# Patient Record
Sex: Female | Born: 1935 | Race: White | Hispanic: No | Smoking: Former smoker
Health system: Southern US, Community
[De-identification: ages and names within clinical notes are randomized; demographics above are authoritative.]

## PROBLEM LIST (undated history)

## (undated) DIAGNOSIS — I340 Nonrheumatic mitral (valve) insufficiency: Secondary | ICD-10-CM

## (undated) DIAGNOSIS — I5022 Chronic systolic (congestive) heart failure: Secondary | ICD-10-CM

## (undated) DIAGNOSIS — I10 Essential (primary) hypertension: Secondary | ICD-10-CM

## (undated) DIAGNOSIS — N189 Chronic kidney disease, unspecified: Secondary | ICD-10-CM

## (undated) DIAGNOSIS — J45909 Unspecified asthma, uncomplicated: Secondary | ICD-10-CM

## (undated) DIAGNOSIS — I4891 Unspecified atrial fibrillation: Secondary | ICD-10-CM

## (undated) DIAGNOSIS — I429 Cardiomyopathy, unspecified: Secondary | ICD-10-CM

## (undated) DIAGNOSIS — I351 Nonrheumatic aortic (valve) insufficiency: Secondary | ICD-10-CM

## (undated) DIAGNOSIS — M109 Gout, unspecified: Secondary | ICD-10-CM

## (undated) DIAGNOSIS — I071 Rheumatic tricuspid insufficiency: Secondary | ICD-10-CM

## (undated) DIAGNOSIS — J449 Chronic obstructive pulmonary disease, unspecified: Secondary | ICD-10-CM

## (undated) HISTORY — PX: ABDOMINAL HYSTERECTOMY: SHX81

## (undated) HISTORY — PX: BLADDER SURGERY: SHX569

---

## 2004-08-02 ENCOUNTER — Ambulatory Visit: Payer: Self-pay | Admitting: Internal Medicine

## 2005-11-02 ENCOUNTER — Ambulatory Visit: Payer: Self-pay | Admitting: Internal Medicine

## 2006-11-06 ENCOUNTER — Ambulatory Visit: Payer: Self-pay | Admitting: Internal Medicine

## 2007-07-02 ENCOUNTER — Ambulatory Visit: Payer: Self-pay | Admitting: Family

## 2007-11-18 ENCOUNTER — Ambulatory Visit: Payer: Self-pay | Admitting: Family

## 2007-12-11 ENCOUNTER — Ambulatory Visit: Payer: Self-pay | Admitting: Internal Medicine

## 2008-01-20 ENCOUNTER — Ambulatory Visit: Payer: Self-pay | Admitting: Internal Medicine

## 2008-04-29 ENCOUNTER — Ambulatory Visit: Payer: Self-pay | Admitting: Family

## 2008-11-11 ENCOUNTER — Ambulatory Visit: Payer: Self-pay | Admitting: Family

## 2009-01-20 ENCOUNTER — Ambulatory Visit: Payer: Self-pay | Admitting: Internal Medicine

## 2009-02-10 ENCOUNTER — Ambulatory Visit: Payer: Self-pay | Admitting: Family

## 2009-07-21 ENCOUNTER — Ambulatory Visit: Payer: Self-pay | Admitting: Family

## 2009-10-15 ENCOUNTER — Ambulatory Visit: Payer: Self-pay | Admitting: Family

## 2010-02-03 ENCOUNTER — Ambulatory Visit: Payer: Self-pay | Admitting: Internal Medicine

## 2010-02-03 ENCOUNTER — Ambulatory Visit: Payer: Self-pay | Admitting: Family

## 2010-05-06 ENCOUNTER — Ambulatory Visit: Payer: Self-pay | Admitting: Family

## 2010-08-17 ENCOUNTER — Ambulatory Visit: Payer: Self-pay | Admitting: Family

## 2011-04-20 ENCOUNTER — Ambulatory Visit: Payer: Self-pay | Admitting: Family Medicine

## 2011-11-15 ENCOUNTER — Ambulatory Visit: Payer: Self-pay | Admitting: Family

## 2013-10-10 ENCOUNTER — Inpatient Hospital Stay: Payer: Self-pay | Admitting: Family Medicine

## 2013-10-10 LAB — COMPREHENSIVE METABOLIC PANEL
ALK PHOS: 89 U/L
Albumin: 3.9 g/dL (ref 3.4–5.0)
Anion Gap: 4 — ABNORMAL LOW (ref 7–16)
BUN: 48 mg/dL — ABNORMAL HIGH (ref 7–18)
Bilirubin,Total: 0.6 mg/dL (ref 0.2–1.0)
CALCIUM: 9.9 mg/dL (ref 8.5–10.1)
CHLORIDE: 101 mmol/L (ref 98–107)
CREATININE: 1.63 mg/dL — AB (ref 0.60–1.30)
Co2: 28 mmol/L (ref 21–32)
EGFR (African American): 33 — ABNORMAL LOW
EGFR (Non-African Amer.): 28 — ABNORMAL LOW
GLUCOSE: 145 mg/dL — AB (ref 65–99)
Osmolality: 282 (ref 275–301)
Potassium: 4.9 mmol/L (ref 3.5–5.1)
SGOT(AST): 35 U/L (ref 15–37)
SGPT (ALT): 27 U/L (ref 12–78)
SODIUM: 133 mmol/L — AB (ref 136–145)
Total Protein: 7.3 g/dL (ref 6.4–8.2)

## 2013-10-10 LAB — URINALYSIS, COMPLETE
Bilirubin,UR: NEGATIVE
Blood: NEGATIVE
GLUCOSE, UR: NEGATIVE mg/dL (ref 0–75)
Ketone: NEGATIVE
Nitrite: POSITIVE
Ph: 6 (ref 4.5–8.0)
Protein: NEGATIVE
SPECIFIC GRAVITY: 1.008 (ref 1.003–1.030)
WBC UR: 59 /HPF (ref 0–5)

## 2013-10-10 LAB — CBC
HCT: 34.4 % — ABNORMAL LOW (ref 35.0–47.0)
HGB: 11.7 g/dL — ABNORMAL LOW (ref 12.0–16.0)
MCH: 33.9 pg (ref 26.0–34.0)
MCHC: 33.9 g/dL (ref 32.0–36.0)
MCV: 100 fL (ref 80–100)
Platelet: 157 10*3/uL (ref 150–440)
RBC: 3.44 10*6/uL — AB (ref 3.80–5.20)
RDW: 13.8 % (ref 11.5–14.5)
WBC: 19.9 10*3/uL — ABNORMAL HIGH (ref 3.6–11.0)

## 2013-10-10 LAB — CK TOTAL AND CKMB (NOT AT ARMC)
CK, Total: 103 U/L
CK-MB: 2.4 ng/mL (ref 0.5–3.6)

## 2013-10-10 LAB — PROTIME-INR
INR: 2.8
Prothrombin Time: 28.6 secs — ABNORMAL HIGH (ref 11.5–14.7)

## 2013-10-10 LAB — TROPONIN I: Troponin-I: <0.02 ng/mL

## 2013-10-12 LAB — CBC WITH DIFFERENTIAL/PLATELET
BASOS ABS: 0.1 10*3/uL (ref 0.0–0.1)
Basophil %: 0.6 %
EOS PCT: 3.2 %
Eosinophil #: 0.3 10*3/uL (ref 0.0–0.7)
HCT: 20.9 % — ABNORMAL LOW (ref 35.0–47.0)
HGB: 7.2 g/dL — ABNORMAL LOW (ref 12.0–16.0)
LYMPHS ABS: 0.9 10*3/uL — AB (ref 1.0–3.6)
LYMPHS PCT: 10.2 %
MCH: 34.4 pg — AB (ref 26.0–34.0)
MCHC: 34.4 g/dL (ref 32.0–36.0)
MCV: 100 fL (ref 80–100)
MONO ABS: 0.9 x10 3/mm (ref 0.2–0.9)
Monocyte %: 9.9 %
Neutrophil #: 7 10*3/uL — ABNORMAL HIGH (ref 1.4–6.5)
Neutrophil %: 76.1 %
Platelet: 97 10*3/uL — ABNORMAL LOW (ref 150–440)
RBC: 2.09 10*6/uL — AB (ref 3.80–5.20)
RDW: 13.5 % (ref 11.5–14.5)
WBC: 9.2 10*3/uL (ref 3.6–11.0)

## 2013-10-12 LAB — BASIC METABOLIC PANEL
Anion Gap: 4 — ABNORMAL LOW (ref 7–16)
BUN: 30 mg/dL — AB (ref 7–18)
CREATININE: 1.16 mg/dL (ref 0.60–1.30)
Calcium, Total: 8.1 mg/dL — ABNORMAL LOW (ref 8.5–10.1)
Chloride: 107 mmol/L (ref 98–107)
Co2: 25 mmol/L (ref 21–32)
EGFR (African American): 49 — ABNORMAL LOW
GFR CALC NON AF AMER: 43 — AB
GLUCOSE: 107 mg/dL — AB (ref 65–99)
OSMOLALITY: 279 (ref 275–301)
POTASSIUM: 4.4 mmol/L (ref 3.5–5.1)
Sodium: 136 mmol/L (ref 136–145)

## 2013-10-12 LAB — PROTIME-INR
INR: 4.5
PROTHROMBIN TIME: 41.4 s — AB (ref 11.5–14.7)

## 2013-10-13 LAB — BASIC METABOLIC PANEL
ANION GAP: 3 — AB (ref 7–16)
Anion Gap: 5 — ABNORMAL LOW (ref 7–16)
BUN: 21 mg/dL — ABNORMAL HIGH (ref 7–18)
BUN: 21 mg/dL — ABNORMAL HIGH (ref 7–18)
CO2: 26 mmol/L (ref 21–32)
Calcium, Total: 8 mg/dL — ABNORMAL LOW (ref 8.5–10.1)
Calcium, Total: 8.3 mg/dL — ABNORMAL LOW (ref 8.5–10.1)
Chloride: 110 mmol/L — ABNORMAL HIGH (ref 98–107)
Chloride: 106 mmol/L (ref 98–107)
Co2: 26 mmol/L (ref 21–32)
Creatinine: 0.92 mg/dL (ref 0.60–1.30)
Creatinine: 1.09 mg/dL (ref 0.60–1.30)
EGFR (African American): 53 — ABNORMAL LOW
EGFR (Non-African Amer.): 56 — ABNORMAL LOW
GFR CALC NON AF AMER: 46 — AB
Glucose: 90 mg/dL (ref 65–99)
Glucose: 118 mg/dL — ABNORMAL HIGH (ref 65–99)
Osmolality: 280 (ref 275–301)
Osmolality: 278 (ref 275–301)
POTASSIUM: 4.4 mmol/L (ref 3.5–5.1)
Potassium: 4.7 mmol/L (ref 3.5–5.1)
Sodium: 139 mmol/L (ref 136–145)
Sodium: 137 mmol/L (ref 136–145)

## 2013-10-13 LAB — CBC WITH DIFFERENTIAL/PLATELET
Basophil #: 0.1 10*3/uL (ref 0.0–0.1)
Basophil %: 0.9 %
EOS ABS: 0.3 10*3/uL (ref 0.0–0.7)
Eosinophil %: 4.1 %
HCT: 19.1 % — AB (ref 35.0–47.0)
HGB: 6.5 g/dL — AB (ref 12.0–16.0)
LYMPHS ABS: 0.8 10*3/uL — AB (ref 1.0–3.6)
LYMPHS PCT: 10.2 %
MCH: 34.1 pg — ABNORMAL HIGH (ref 26.0–34.0)
MCHC: 34.1 g/dL (ref 32.0–36.0)
MCV: 100 fL (ref 80–100)
MONOS PCT: 9.5 %
Monocyte #: 0.8 x10 3/mm (ref 0.2–0.9)
NEUTROS ABS: 6 10*3/uL (ref 1.4–6.5)
NEUTROS PCT: 75.3 %
Platelet: 101 10*3/uL — ABNORMAL LOW (ref 150–440)
RBC: 1.91 10*6/uL — AB (ref 3.80–5.20)
RDW: 13.6 % (ref 11.5–14.5)
WBC: 7.9 10*3/uL (ref 3.6–11.0)

## 2013-10-13 LAB — PROTIME-INR
INR: 4.8
Prothrombin Time: 43.6 secs — ABNORMAL HIGH (ref 11.5–14.7)

## 2013-10-14 ENCOUNTER — Encounter: Payer: Self-pay | Admitting: Internal Medicine

## 2013-10-14 LAB — BASIC METABOLIC PANEL
Anion Gap: 3 — ABNORMAL LOW (ref 7–16)
BUN: 21 mg/dL — ABNORMAL HIGH (ref 7–18)
CHLORIDE: 108 mmol/L — AB (ref 98–107)
CO2: 28 mmol/L (ref 21–32)
Calcium, Total: 8.4 mg/dL — ABNORMAL LOW (ref 8.5–10.1)
Creatinine: 1.05 mg/dL (ref 0.60–1.30)
EGFR (African American): 56 — ABNORMAL LOW
EGFR (Non-African Amer.): 48 — ABNORMAL LOW
GLUCOSE: 81 mg/dL (ref 65–99)
Osmolality: 280 (ref 275–301)
POTASSIUM: 4.8 mmol/L (ref 3.5–5.1)
Sodium: 139 mmol/L (ref 136–145)

## 2013-10-14 LAB — CBC WITH DIFFERENTIAL/PLATELET
Basophil #: 0.1 10*3/uL (ref 0.0–0.1)
Basophil %: 0.7 %
Eosinophil #: 0.5 10*3/uL (ref 0.0–0.7)
Eosinophil %: 4.8 %
HCT: 27 % — ABNORMAL LOW (ref 35.0–47.0)
HGB: 9.6 g/dL — ABNORMAL LOW (ref 12.0–16.0)
Lymphocyte #: 0.8 10*3/uL — ABNORMAL LOW (ref 1.0–3.6)
Lymphocyte %: 9 %
MCH: 34.2 pg — AB (ref 26.0–34.0)
MCHC: 35.7 g/dL (ref 32.0–36.0)
MCV: 96 fL (ref 80–100)
Monocyte #: 1 x10 3/mm — ABNORMAL HIGH (ref 0.2–0.9)
Monocyte %: 10.3 %
NEUTROS ABS: 7 10*3/uL — AB (ref 1.4–6.5)
Neutrophil %: 75.2 %
Platelet: 119 10*3/uL — ABNORMAL LOW (ref 150–440)
RBC: 2.82 10*6/uL — ABNORMAL LOW (ref 3.80–5.20)
RDW: 15.7 % — AB (ref 11.5–14.5)
WBC: 9.3 10*3/uL (ref 3.6–11.0)

## 2013-10-14 LAB — URINE CULTURE

## 2013-10-15 LAB — CBC WITH DIFFERENTIAL/PLATELET
Bands: 3 %
Basophil: 1 %
Eosinophil: 8 %
HCT: 27.5 % — ABNORMAL LOW (ref 35.0–47.0)
HGB: 9.6 g/dL — ABNORMAL LOW (ref 12.0–16.0)
LYMPHS PCT: 10 %
MCH: 34 pg (ref 26.0–34.0)
MCHC: 34.9 g/dL (ref 32.0–36.0)
MCV: 97 fL (ref 80–100)
METAMYELOCYTE: 1 %
Monocytes: 12 %
Platelet: 136 10*3/uL — ABNORMAL LOW (ref 150–440)
RBC: 2.82 10*6/uL — AB (ref 3.80–5.20)
RDW: 15 % — ABNORMAL HIGH (ref 11.5–14.5)
SEGMENTED NEUTROPHILS: 65 %
WBC: 8.6 10*3/uL (ref 3.6–11.0)

## 2013-10-15 LAB — PROTIME-INR
INR: 2
PROTHROMBIN TIME: 22.2 s — AB (ref 11.5–14.7)

## 2013-10-15 LAB — CULTURE, BLOOD (SINGLE)

## 2013-10-21 LAB — PROTIME-INR
INR: 1.1
Prothrombin Time: 14.2 secs (ref 11.5–14.7)

## 2013-10-22 ENCOUNTER — Encounter: Payer: Self-pay | Admitting: Internal Medicine

## 2014-03-20 ENCOUNTER — Ambulatory Visit: Payer: Self-pay | Admitting: Family Medicine

## 2014-11-14 NOTE — Consult Note (Signed)
PATIENT NAME:  Alicia Trujillo, Alicia Trujillo MR#:  [id number] DATE OF BIRTH:  [birth year]  DATE OF CONSULTATION:  10/13/2013  CONSULTING PHYSICIAN:  Leitha SchullerMichael J. Lehua Flores, MD  REASON FOR CONSULTATION: Right-sided pelvic fracture.   HISTORY OF PRESENT ILLNESS: The patient is an 81 year old who suffered a fall. She had pain in the right side of her hip. X-ray revealed superior and inferior rami fractures. She reports pain, and she describes it more as an ache.   PHYSICAL EXAMINATION: She has some pain with logrolling of the leg. She has point tenderness over the anterior pubis superiorly. X-rays show minimally displaced fractures. No evidence of more extensive injury.   CLINICAL IMPRESSION: Right-sided pubic rami fractures.   RECOMMENDATION: Weight-bearing as tolerated with physical therapy. Discussed with her and her son that this usually takes about a month to heal adequately where it is no longer inhibiting ability to ambulate. Encouraged her to ambulate as tolerated.   ____________________________ Leitha SchullerMichael J. Nikyah Lackman, MD mjm:sg D: 10/14/2013 06:56:51 ET T: 10/14/2013 07:06:01 ET JOB#: [id number]  cc: Leitha SchullerMichael J. Maripat Borba, MD, <Dictator> Leitha SchullerMICHAEL J Mycala Warshawsky MD ELECTRONICALLY SIGNED 10/14/2013 9:42

## 2014-11-14 NOTE — Discharge Summary (Signed)
PATIENT NAME:  Alicia KannerBRUNER, [name] MR#:  [id number] DATE OF BIRTH:  [birth year]  DATE OF ADMISSION:  10/10/2013 DATE OF DISCHARGE:  10/15/2013   DISCHARGE DIAGNOSES:  1. Acute pelvic fracture.  2. Pyuria, negative urine culture.  3. Anemia.  4. Presyncopal episode.  5. Atrial fibrillation, Coumadin is being held.  6. Asthma and chronic obstructive pulmonary disease   DISCHARGE MEDICATIONS:  1. Multivitamin 1 tablet p.o. daily.  2. Calcium carbonate 600 mg p.o. daily 3. Spironolactone 25 mg p.o. b.i.d.  4. Singulair 10 mg p.o. daily.  5. Toprol 25 mg p.o. b.i.d.  6. Combivent as directed.  7. Altace 2.5 mg p.o. daily.  8. Fexofenadine 180 mg p.o. daily.  9. Furosemide 40 mg p.o. b.i.d.  10. Allopurinol 100 mg p.o. b.i.d.  11. Tramadol 50 mg q.6 hours as needed for pain.   MEDICATIONS TO BE HELD: Coumadin.   CONSULTATIONS: Orthopedics.   PROCEDURES: The patient did receive 2 units of packed red blood cells.   PERTINENT LABORATORY DATA ON DATE OF DISCHARGE: Hemoglobin was 9.6, INR of 2. Urine culture showed no significant growth of bacteria.   BRIEF HOSPITAL COURSE:  1. Acute pelvic fracture status post presyncopal episode. The patient reports having a fall causing a pelvic fracture. She was evaluated by orthopedics, who did not think that she needed any surgical intervention. Will do pain control and PT and ambulate as tolerated.  2. Acute anemia, unsure etiology, but likely due to recent pelvic fracture. Hemoglobin dropped as low as 6.5. She was transfused 2 units and responded with a hemoglobin of 9.6, which has remained stable in 48 hours. She has no acute bleeding from any other site.  3. Pyuria. The patient initially came in with a urinalysis positive for white blood cells. She was essentially asymptomatic. Urine culture showed no growth. She did get 3 days of ceftriaxone. Will discontinue any further oral antibiotics at this time, especially with her Coumadin dose being  supratherapeutic upon admission.  4. Atrial fibrillation. The patient is rate controlled and asymptomatic. She did have a supratherapeutic INR initially, INR of 4.8. Coumadin was held. Because she has had recent antibiotics, I want to continue to hold the Coumadin at this time, especially with her acute anemia. May consider restarting the Coumadin as an outpatient once her hemoglobin stabilizes and she follows up in the clinic. I would recommend an INR checked in the next 3 to 4 days and will document that in our notes.  5. Other chronic issues are stable. Continue home regimen.   DISPOSITION: She will need further rehab through physical therapy. She is going to a skilled nursing facility. Follow up with Dr. Burnadette PopLinthavong after discharge from the rehab. Again, she needs a Coumadin check in 3 or 4 days.   ____________________________ Marisue IvanKanhka Rei Contee, MD kl:lb D: 10/15/2013 10:31:41 ET T: 10/15/2013 11:35:00 ET JOB#: [id number]  cc: Marisue IvanKanhka Marv Alfrey, MD, <Dictator> Marisue IvanKANHKA Kenyatte Gruber MD ELECTRONICALLY SIGNED 10/27/2013 10:35

## 2014-11-14 NOTE — Consult Note (Signed)
Brief Consult Note: Diagnosis: right side pelvic fracture, stable patter.   Patient was seen by consultant.   Recommend further assessment or treatment.   Orders entered.   Comments: PT ordered, WBAT.  Electronic Signatures: Leitha SchullerMenz, Suzann Lazaro J (MD)  (Signed 23-Mar-15 17:01)  Authored: Brief Consult Note   Last Updated: 23-Mar-15 17:01 by Leitha SchullerMenz, Emil Klassen J (MD)

## 2014-11-14 NOTE — H&P (Signed)
PATIENT NAME:  Alicia Trujillo, [name] MR#:  [id number] DATE OF BIRTH:  03/28/1925  DATE OF ADMISSION:  10/10/2013   [facility]  REFERRING PHYSICIAN: Dr. Carollee Trujillo.   CHIEF COMPLAINT: Fall today.   HISTORY OF PRESENT ILLNESS:  An 81 year old Caucasian female with a history of CHF, asthma, who was sent to ED due to fall today. The patient tripped and fell at  home today. The patient when to PCP's office where the patient was almost faint, so the patient was sent to the ED for further evaluation. In ED, the patient was noted to have a UTI. Blood pressure was low at 87 systolic. The patient was treated with normal saline bolus. In addition, the patient has leukocytosis, was treated with antibiotics. The patient is hard of hearing, very difficult to get a detailed history. History is from Dr. Carollee Trujillo and the patient's son.   PAST MEDICAL HISTORY: CHF, asthma.   PAST SURGICAL HISTORY: Hysterectomy.   SOCIAL HISTORY: No smoking or drinking or illicit drugs.   FAMILY HISTORY: Heart and lung disease in her family.    ALLERGIES: SULFA AND LATEX.   HOME MEDICATIONS:  1.  Coumadin 3 mg p.o. once a day on Saturday, Sunday, and 2.5 mg from Monday through Friday.  2.  Spironolactone 25 mg 1 tablet b.i.d.  3.  Singulair 10 mg p.o. daily.  4.  Multivitamin 1 tablet p.o. daily.  5.  Lopressor 25 mg p.o. b.i.d. 6.  Lasix 40 mg p.o. p.o. b.i.d.  7.  Fexofenadine tablet 180 mg p.o. once a day.  8.  Combivent aerosol 2 puffs 4 to 6 times a day p.r.n.  9.  Calcium carbonate tablets 600 mg p.o. daily.  10.  Altace capsule 2.5 mg p.o. daily.  11. Allopurinol 100 mg p.o. b.i.d.   REVIEW OF SYSTEMS:  The patient is hard of hearing. It is very difficulty to get a review of systems, but the patient denies any symptoms.     PHYSICAL EXAMINATION: VITAL SIGNS: Temperature 98.4. Blood pressure was 87/52, and now increased to over 105/51; pulse 94, O2 saturation 99% on room air.  Respirations 24.  GENERAL: The patient is alert, awake, oriented, in no acute distress.  HEENT: Pupils round, equal and reactive to light and accommodation. Moist oral mucosa. Clear oropharynx.  NECK: Supple. No JVD or carotid bruit and no lymphadenopathy. CARDIOVASCULAR: S1 and S2, regular rate and rhythm. No murmurs or gallops.   PULMONARY: Bilateral air entry. No wheezing. No rales. No use of accessory muscle to breathe.  ABDOMEN: Soft. No distention. No tenderness. No organomegaly. Bowel sounds present.  EXTREMITIES: No edema, clubbing or cyanosis. No calf tenderness. Bilateral pedal pulses present.  SKIN: No rash or jaundice.  NEUROLOGIC: The patient is alert, awake, oriented but was hard of hearing. Follows commands. No focal deficit, but the patient is unable to move bilateral lower extremities due to pain.   LABORATORY DATA: Urinalysis shows WBC 59, nitrite positive. CK 103, CK-MB 2.4. Glucose 145, BUN 48, creatinine 1.63, sodium 133, potassium 4.9, chloride 101, bicarb 28. Liver function test is normal. WBC 19.9, hemoglobin 11.7, platelets 157. INR 2.8. Femoral x-ray showed superior pubic ramus fracture.  Pelvis x-ray showed right superior pubic ramus  fracture. IMPRESSIONS: 1.  Sepsis.  2.  Urinary tract infection.  3.  Hypotension.  4.  Acute renal failure.  5.  Hyponatremia.  6.  Pelvic fracture.  7.  History of congestive heart failure.  8.  Asthma.   PLAN OF TREATMENT: 1.  The patient will be admitted to medical floor. We will give her Rocephin and follow up her urine culture, CBC and blood culture.  2.  We will hold the patient's spironolactone, Lasix, Altace, allopurinol and start IV normal saline. Follow up her BMP.  3.  For history of asthma, continue Combivent nebulizer p.r.n.  4.  The patient's son said the patient is on Coumadin due to CHF. We will continue Coumadin. Follow up INR.  5.  I discussed the patient's condition and plan of treatment with the patient and the  patient's son.  The patient wants FULL CODE.   TIME SPENT: About 58 minutes.    ____________________________ Shaune Pollack, MD qc:dmm D: 10/10/2013 21:21:25 ET T: 10/10/2013 21:44:33 ET JOB#: [id number]  cc: Shaune Pollack, MD, <Dictator> Shaune Pollack MD ELECTRONICALLY SIGNED 10/12/2013 15:59

## 2015-01-03 ENCOUNTER — Encounter: Payer: Self-pay | Admitting: *Deleted

## 2015-01-03 ENCOUNTER — Emergency Department
Admission: EM | Admit: 2015-01-03 | Discharge: 2015-01-03 | Disposition: A | Discharge status: Home / Self Care | Payer: Medicare Other | Attending: Emergency Medicine | Admitting: Emergency Medicine

## 2015-01-03 DIAGNOSIS — Z79899 Other long term (current) drug therapy: Secondary | ICD-10-CM | POA: Insufficient documentation

## 2015-01-03 DIAGNOSIS — I129 Hypertensive chronic kidney disease with stage 1 through stage 4 chronic kidney disease, or unspecified chronic kidney disease: Secondary | ICD-10-CM | POA: Diagnosis not present

## 2015-01-03 DIAGNOSIS — N189 Chronic kidney disease, unspecified: Secondary | ICD-10-CM | POA: Insufficient documentation

## 2015-01-03 DIAGNOSIS — Y9289 Other specified places as the place of occurrence of the external cause: Secondary | ICD-10-CM | POA: Insufficient documentation

## 2015-01-03 DIAGNOSIS — D684 Acquired coagulation factor deficiency: Secondary | ICD-10-CM | POA: Diagnosis not present

## 2015-01-03 DIAGNOSIS — Y9389 Activity, other specified: Secondary | ICD-10-CM | POA: Diagnosis not present

## 2015-01-03 DIAGNOSIS — S81812A Laceration without foreign body, left lower leg, initial encounter: Secondary | ICD-10-CM | POA: Insufficient documentation

## 2015-01-03 DIAGNOSIS — Z87891 Personal history of nicotine dependence: Secondary | ICD-10-CM | POA: Insufficient documentation

## 2015-01-03 DIAGNOSIS — W228XXA Striking against or struck by other objects, initial encounter: Secondary | ICD-10-CM | POA: Insufficient documentation

## 2015-01-03 DIAGNOSIS — Z91040 Latex allergy status: Secondary | ICD-10-CM | POA: Diagnosis not present

## 2015-01-03 DIAGNOSIS — Z7901 Long term (current) use of anticoagulants: Secondary | ICD-10-CM | POA: Diagnosis not present

## 2015-01-03 DIAGNOSIS — S80822A Blister (nonthermal), left lower leg, initial encounter: Secondary | ICD-10-CM | POA: Insufficient documentation

## 2015-01-03 DIAGNOSIS — Y998 Other external cause status: Secondary | ICD-10-CM | POA: Diagnosis not present

## 2015-01-03 DIAGNOSIS — T148XXA Other injury of unspecified body region, initial encounter: Secondary | ICD-10-CM

## 2015-01-03 DIAGNOSIS — IMO0002 Reserved for concepts with insufficient information to code with codable children: Secondary | ICD-10-CM

## 2015-01-03 HISTORY — DX: Chronic kidney disease, unspecified: N18.9

## 2015-01-03 HISTORY — DX: Unspecified atrial fibrillation: I48.91

## 2015-01-03 HISTORY — DX: Essential (primary) hypertension: I10

## 2015-01-03 HISTORY — DX: Gout, unspecified: M10.9

## 2015-01-03 HISTORY — DX: Unspecified asthma, uncomplicated: J45.909

## 2015-01-03 HISTORY — DX: Cardiomyopathy, unspecified: I42.9

## 2015-01-03 HISTORY — DX: Chronic obstructive pulmonary disease, unspecified: J44.9

## 2015-01-03 LAB — CBC WITH DIFFERENTIAL/PLATELET
BASOS ABS: 0 10*3/uL (ref 0–0.1)
Basophils Relative: 0 %
Eosinophils Absolute: 0 10*3/uL (ref 0–0.7)
Eosinophils Relative: 0 %
HEMATOCRIT: 29.1 % — AB (ref 35.0–47.0)
Hemoglobin: 9.5 g/dL — ABNORMAL LOW (ref 12.0–16.0)
Lymphs Abs: 0.6 10*3/uL — ABNORMAL LOW (ref 1.0–3.6)
MCH: 30.8 pg (ref 26.0–34.0)
MCHC: 32.4 g/dL (ref 32.0–36.0)
MCV: 95 fL (ref 80.0–100.0)
Monocytes Absolute: 0.6 10*3/uL (ref 0.2–0.9)
Neutro Abs: 5.8 10*3/uL (ref 1.4–6.5)
Platelets: 65 10*3/uL — ABNORMAL LOW (ref 150–440)
RBC: 3.07 MIL/uL — AB (ref 3.80–5.20)
RDW: 17.5 % — ABNORMAL HIGH (ref 11.5–14.5)
WBC: 7 10*3/uL (ref 3.6–11.0)

## 2015-01-03 LAB — BASIC METABOLIC PANEL
Anion gap: 14 (ref 5–15)
BUN: 117 mg/dL — AB (ref 6–20)
CALCIUM: 9 mg/dL (ref 8.9–10.3)
CHLORIDE: 86 mmol/L — AB (ref 101–111)
CO2: 34 mmol/L — ABNORMAL HIGH (ref 22–32)
Creatinine, Ser: 2.34 mg/dL — ABNORMAL HIGH (ref 0.44–1.00)
GFR calc Af Amer: 20 mL/min — ABNORMAL LOW (ref >60)
GFR calc non Af Amer: 18 mL/min — ABNORMAL LOW (ref >60)
Glucose, Bld: 109 mg/dL — ABNORMAL HIGH (ref 65–99)
POTASSIUM: 2.8 mmol/L — AB (ref 3.5–5.1)
SODIUM: 134 mmol/L — AB (ref 135–145)

## 2015-01-03 LAB — PROTIME-INR
INR: 4.92 — AB
Prothrombin Time: 46.6 seconds — ABNORMAL HIGH (ref 11.4–15.0)

## 2015-01-03 MED ORDER — POTASSIUM CHLORIDE ER 10 MEQ PO TBCR
20.0000 meq | EXTENDED_RELEASE_TABLET | Freq: Two times a day (BID) | ORAL | Status: DC
Start: 2015-01-03 — End: 2015-01-19

## 2015-01-03 MED ORDER — POTASSIUM CHLORIDE CRYS ER 20 MEQ PO TBCR
40.0000 meq | EXTENDED_RELEASE_TABLET | Freq: Once | ORAL | Status: AC
  Administered 2015-01-03: 40 meq via ORAL

## 2015-01-03 MED ORDER — PHYTONADIONE 5 MG PO TABS
ORAL_TABLET | ORAL | Status: AC
  Administered 2015-01-03: 2.5 mg via ORAL
  Filled 2015-01-03: qty 1

## 2015-01-03 MED ORDER — PHYTONADIONE 5 MG PO TABS
2.5000 mg | ORAL_TABLET | Freq: Once | ORAL | Status: AC
  Administered 2015-01-03: 2.5 mg via ORAL

## 2015-01-03 MED ORDER — POTASSIUM CHLORIDE CRYS ER 20 MEQ PO TBCR
EXTENDED_RELEASE_TABLET | ORAL | Status: AC
  Administered 2015-01-03: 20 meq via ORAL
  Filled 2015-01-03: qty 1

## 2015-01-03 MED ORDER — POTASSIUM CHLORIDE CRYS ER 20 MEQ PO TBCR
20.0000 meq | EXTENDED_RELEASE_TABLET | Freq: Once | ORAL | Status: AC
  Administered 2015-01-03: 20 meq via ORAL

## 2015-01-03 MED ORDER — POTASSIUM CHLORIDE CRYS ER 20 MEQ PO TBCR
EXTENDED_RELEASE_TABLET | ORAL | Status: AC
  Administered 2015-01-03: 40 meq via ORAL
  Filled 2015-01-03: qty 2

## 2015-01-03 NOTE — Discharge Instructions (Signed)
Do not give Coumadin tonight or tomorrow night. Speak with Dr. Burnadette Pop about when to restart your Coumadin and with what dose. Follow-up with the doctors at [facility] clinic as planned for ear leg wraps and for ongoing wound care.  If you have bleeding that passes through the dressing or if you have other urgent concerns return to the emergency department immediately.   Anticoagulation, Generic Anticoagulants are medicines used to prevent clots from developing in your veins. These medicine are also known as blood thinners. If blood clots are untreated, they could travel to your lungs. This is called a pulmonary embolus. A blood clot in your lungs can be fatal.  Health care providers often use anticoagulants to prevent clots following surgery. Anticoagulants are also used along with aspirin when the heart is not getting enough blood. Another anticoagulant called warfarin is started 2 to 3 days after a rapid-acting injectable anticoagulant is started. The rapid-acting anticoagulants are usually continued until warfarin has begun to work. Your health care provider will judge this length of time by blood tests known as the prothrombin time (PT) and International Normalization Ratio (INR). This means that your blood is at the necessary and best level to prevent clots. RISKS AND COMPLICATIONS  If you have received recent epidural anesthesia, spinal anesthesia, or a spinal tap while receiving anticoagulants, you are at risk for developing a blood clot in or around the spine. This condition could result in long-term or permanent paralysis.  Because anticoagulants thin your blood, severe bleeding may occur from any tissue or organ. Symptoms of the blood being too thin may include:  Bleeding from the nose or gums that does not stop quickly.  Blood in bowel movements which may appear as bright red, dark, or black tarry stools.  Blood in the urine which may appear as pink, red, or brown urine.  Unusual  bruising or bruising easily.  A cut that does not stop bleeding within 10 minutes.  Vomiting blood or continuous nausea for more than 1 day.  Coughing up blood.  Broken blood vessels in your eye (subconjunctival hemorrhage).  Abdominal or back pain with or without flank bruising.  Sudden, severe headache.  Sudden weakness or numbness of the face, arm, or leg, especially on one side of the body.  Sudden confusion.  Trouble speaking (aphasia) or understanding.  Sudden trouble seeing in one or both eyes.  Sudden trouble walking.  Dizziness.  Loss of balance or coordination.  Vaginal bleeding.  Swelling or pain at an injection site.  Superficial fat tissue death (necrosis) which may cause skin scarring. This is more common in women and may first present as pain in the waist, thighs, or buttocks.  Fever.  Too little anticoagulation continues to allow the risk for blood clots. HOME CARE INSTRUCTIONS   Due to the complications of anticoagulants, it is very important that you take your anticoagulant as directed by your health care provider. Anticoagulants need to be taken exactly as instructed. Be sure you understand all your anticoagulant instructions.  Keep all follow-up appointments with your health care provider as directed. It is very important to keep your appointments. Not keeping appointments could result in a chronic or permanent injury, pain, or disability.  Warfarin. Your health care provider will advise you on the length of treatment (usually 3-6 months, sometimes lifelong).  Take warfarin exactly as directed by your health care provider. It is recommended that you take your warfarin dose at the same time of the day. It is  preferred that you take warfarin in the late afternoon. If you have been told to stop taking warfarin, do not resume taking warfarin until directed to do so by your health care provider. Follow your health care provider's instructions if you  accidentally take an extra dose or miss a dose of warfarin. It is very important to take warfarin as directed since bleeding or blood clots could result in chronic or permanent injury, pain, or disability.  Too much and too little warfarin are both dangerous. Too much warfarin increases the risk of bleeding. Too little warfarin continues to allow the risk for blood clots. While taking warfarin, you will need to have regular blood tests to measure your blood clotting time. These blood tests usually include both the prothrombin time (PT) and International Normalized Ratio (INR) tests. The PT and INR results allow your health care provider to adjust your dose of warfarin. The dose can change for many reasons. It is critically important that you have your PT and INR levels drawn exactly as directed. Your warfarin dose may stay the same or change depending on what the PT and INR results are. Be sure to follow up with your health care provider regarding your PT and INR test results and what your warfarin dosage should be.  Many medicines can interfere with warfarin and affect the PT and INR results. You must tell your health care provider about any and all medicines you take, this includes all vitamins and supplements. Ask your health care provider before taking these. Prescription and over-the-counter medicine consistency is critical to warfarin management. It is important that potential interactions are checked before you start a new medicine. Be especially cautious with aspirin and anti-inflammatory medicines. Ask your health care provider before taking these. Medicines such as antibiotics and acid-reducing medicine can interact with warfarin and can cause an increased warfarin effect. Warfarin can also interfere with the effectiveness of medicines you are taking. Do not take or discontinue any prescribed or over-the-counter medicine except on the advice of your health care provider or pharmacist.  Some vitamins,  supplements, and herbal products interfere with the effectiveness of warfarin. Vitamin E may increase the anticoagulant effects of warfarin. Vitamin K may can cause warfarin to be less effective. Do not take or discontinue any vitamin, supplement, or herbal product except on the advice of your health care provider or pharmacist.  Eat what you normally eat and keep the vitamin K content of your diet consistent. Avoid major changes in your diet, or notify your health care provider before changing your diet. Suddenly getting a lot more vitamin K could cause your blood to clot too quickly. A sudden decrease in vitamin K intake could cause your blood to clot too slowly. These changes in vitamin K intake could lead to dangerous blood clotsor to bleeding. To keep your vitamin K intake consistent, you must be aware of which foods contain moderate or high amounts of vitamin K. Some foods high in vitamin K include spinach, kale, broccoli, cabbage, greens, Brussels sprouts, asparagus, Bok [place]oy, coleslaw, parsley, and green tea. Arrange a visit with a dietitian to answer your questions.  If you have a loss of appetite or get the stomach flu (viral gastroenteritis), talk to your health care provider as soon as possible. A decrease in your normal vitamin K intake can make you more sensitive to your usual dose of warfarin.  Some medical conditions may increase your risk for bleeding while you are taking warfarin. A fever, diarrhea  lasting more than a day, worsening heart failure, or worsening liver function are some medical conditions that could affect warfarin. Contact your health care provider if you have any of these medical conditions.  Alcohol can change the body's ability to handle warfarin. It is best to avoid alcoholic drinks or consume only very small amounts while taking warfarin. Notify your health care provider if you change your alcohol intake. A sudden increase in alcohol use can increase your risk of  bleeding. Chronic alcohol use can cause warfarin to be less effective.  Be careful not to cut yourself when using sharp objects or while shaving.  Inform all your health care providers and your dentist that you take an anticoagulant.  Limit physical activities or sports that could result in a fall or cause injury. Avoid contact sports.  Wear medical alert jewelry or carry a medical alert card. SEEK IMMEDIATE MEDICAL CARE IF:  You cough up blood.  You have dark or black stools or there is bright red blood coming from your rectum.  You vomit blood or have nausea for more than 1 day.  You have blood in the urine or pink colored urine.  You have unusual bruising or have increased bruising.  You have bleeding from the nose or gums that does not stop quickly.  You have a cut that does not stop bleeding within a 2-3 minutes.  You have sudden weakness or numbness of the face, arm, or leg, especially on one side of the body.  You have sudden confusion.  You have trouble speaking (aphasia) or understanding.  You have sudden trouble seeing in one or both eyes.  You have sudden trouble walking.  You have dizziness.  You have a loss of balance or coordination.  You have a sudden, severe headache.  You have a serious fall or head injury, even if you are not bleeding.  You have swelling or pain at an injection site.  You have unexplained tenderness or pain in the abdomen, back, waist, thighs or buttocks.  You have a fever. Any of these symptoms may represent a serious problem that is an emergency. Do not wait to see if the symptoms will go away. Get medical help right away. Call your local emergency services (911 in [place] Do not drive yourself to the hospital. Document Released: 07/10/2005 Document Revised: 07/15/2013 Document Reviewed: 02/12/2008 [facility] Patient Information 2015 [place], [place]. This information is not intended to replace advice given to you by your health care  provider. Make sure you discuss any questions you have with your health care provider.

## 2015-01-03 NOTE — ED Notes (Signed)
Pt arrived due to bleeding from left leg. Pt reports hitting leg on side of bath tub today. Pt reports increased swelling and blistering to left leg with a split in large blister on pts left shin. Pts leg wrapped upon arrival. MD removed wrap. Small amount of bleeding continues at this time. Pt on warfarin.

## 2015-01-03 NOTE — ED Provider Notes (Signed)
[facility] Emergency Department Provider Note  ____________________________________________  Time seen: 48  I have reviewed the triage vital signs and the nursing notes.  Patient is pleasant with apparent mild dementia. History is from her at also from her daughter and later from her son who lives with her.   HISTORY  Chief Complaint Leg Injury  bleeding from left leg    HPI Alicia Trujillo is a 81 y.o. female who is on Coumadin. She was getting out of the shower when she hit her leg on the side of the bathtub. The family reports her legs have been a little dark lately but this injury to an immediate blood blister forming on the left leg and the blister tore open and she began to bleed copiously. The family wrapped the leg and a towel and brought her to the emergency department. Upon placing her in the treatment room, a moderate amount of blood dripped from the towel dressing onto the floor. The patient denies pain. She is alert and communicative.    Past Medical History  Diagnosis Date  . Asthma   . COPD (chronic obstructive pulmonary disease)   . A-fib   . Gout   . Essential hypertension   . Cardiomyopathy   . Chronic kidney disease     Congestive heart failure COPD   There are no active problems to display for this patient.   History reviewed. No pertinent past surgical history.  Current Outpatient Rx  Name  Route  Sig  Dispense  Refill  . albuterol (PROAIR HFA) 108 (90 BASE) MCG/ACT inhaler   Inhalation   Inhale 2 puffs into the lungs every 4 (four) hours as needed.         Marland Kitchen allopurinol (ZYLOPRIM) 100 MG tablet   Oral   Take 100 mg by mouth daily.      0   . calcium-vitamin D (CALCIUM 500/D) 500-200 MG-UNIT per tablet   Oral   Take 1 tablet by mouth daily.         . fexofenadine (ALLEGRA) 180 MG tablet   Oral   Take 180 mg by mouth daily.         . Fluticasone-Salmeterol (ADVAIR DISKUS) 250-50 MCG/DOSE AEPB    Inhalation   Inhale 1 puff into the lungs every 12 (twelve) hours.         . furosemide (LASIX) 40 MG tablet   Oral   Take 40 mg by mouth 2 (two) times daily as needed.         . metolazone (ZAROXOLYN) 2.5 MG tablet   Oral   Take 2.5 mg by mouth daily. Stop if BP drops below 90/60.         Marland Kitchen metoprolol tartrate (LOPRESSOR) 25 MG tablet   Oral   Take 12.5 mg by mouth 2 (two) times daily.         . montelukast (SINGULAIR) 10 MG tablet   Oral   Take 10 mg by mouth daily.         . Multiple Vitamin (MULTI-VITAMINS) TABS   Oral   Take 1 tablet by mouth daily.         Marland Kitchen spironolactone (ALDACTONE) 25 MG tablet   Oral   Take 25 mg by mouth daily.         Marland Kitchen warfarin (COUMADIN) 2 MG tablet   Oral   Take 2 mg by mouth daily.         . potassium chloride (  K-DUR) 10 MEQ tablet   Oral   Take 2 tablets (20 mEq total) by mouth 2 (two) times daily.   6 tablet   0     Allergies Latex; Levofloxacin; and Sulfa antibiotics  History reviewed. No pertinent family history.  Social History History  Substance Use Topics  . Smoking status: Former Games developer  . Smokeless tobacco: Never Used  . Alcohol Use: No    Review of Systems  Constitutional: Negative for fever. ENT: Negative for sore throat. Cardiovascular: Negative for chest pain. History of congestive heart failure, suspect history of A. Fib as she is on Coumadin. Respiratory: Negative for shortness of breath. Gastrointestinal: Negative for abdominal pain, vomiting and diarrhea. Genitourinary: Negative for dysuria. Musculoskeletal: Negative for back pain. Skin: Blood blister with laceration to the left lower leg. See history of present illness Neurological: Negative for headaches   10-point ROS otherwise negative.  ____________________________________________   PHYSICAL EXAM:  VITAL SIGNS: ED Triage Vitals  Enc Vitals Group     BP 01/03/15 1452 107/70 mmHg     Pulse Rate 01/03/15 1452 77     Resp  01/03/15 1452 16     Temp 01/03/15 1452 97.8 F (36.6 C)     Temp Source 01/03/15 1452 Oral     SpO2 01/03/15 1452 99 %     Weight 01/03/15 1452 113 lb 8 oz (51.483 kg)     Height 01/03/15 1452  (1.626 m)     Head Cir --      Peak Flow --      Pain Score --      Pain Loc --      Pain Edu? --      Excl. in GC? --     Constitutional: Alert, no acute distress, but with active moderate bleeding from left lower leg. ENT   Head: Normocephalic and atraumatic.   Nose: No congestion/rhinnorhea.   Mouth/Throat: Mucous membranes are moist. Cardiovascular: Normal rate, regular rhythm. Respiratory: Normal respiratory effort without tachypnea. Breath sounds are clear and equal bilaterally. No wheezes/rales/rhonchi. Gastrointestinal: Soft and nontender. No distention.  Back: No muscle spasm, no tenderness, no CVA tenderness. Musculoskeletal: Nontender with normal range of motion in all extremities.  No noted edema. Neurologic:  Normal speech and language. No gross focal neurologic deficits are appreciated.  Skin:  There is a skin tear on the lower left leg that is approximately 3 cm in one direction and 3 cm perpendicular to this forming a small triangle. It is directly over a large blood blister with coagulated blood. At this time of exam the bleeding is minimal with just some small oozing from one area. I suspect the moderate on a blood that poured onto the treatment room floor was from blood that had pooled during her transport to and through the emergency department. The blood blister and the surrounding skins are quite dark due to the underlying bleeding. Psychiatric: Mood and affect are normal. Speech and behavior are normal.  ____________________________________________    LABS (pertinent positives/negatives)  CBC: White blood cell count 7.0, hemoglobin 9.5 PT/INR:  46.6/4.92  ____________________________________________  ____________________________________________   INITIAL IMPRESSION / ASSESSMENT AND PLAN / ED COURSE  Patient with bleeding from blood blister on left leg. The INR is elevated at 4.9. We will give her a dose of vitamin K and advised discontinuation of Coumadin or 2 days. During that time. The patient or the patient's daughter should contact the patient's primary care doctor to discuss the dose  that they wish the Coumadin to be restarted that.  ----------------------------------------- 5:28 PM on 01/03/2015 -----------------------------------------  I've called the on-call surgeon, Dr. Anda Kraft, to review the blood blister and wound management issues with it. He agrees with me that to attempt to extract any of the congealed blood would simply trigger additional bleeding. I will place a dressing over the wound.  The potassium is reported at 2.4.  We will replace K with 40 meq PO.  ----------------------------------------- 8:58 PM on 01/03/2015 -----------------------------------------  We changed the dressing on her left leg to start fresh and make sure she had no ongoing bleeding in the emergency department. I just rechecked that dressing and the patient. There is no ongoing bleeding and she is alert and at her baseline. I have spoken with the family about her INR at 4.9 and how they need to stop the Coumadin for probably 2 days. I've instructed them to call Dr. Burnadette Pop or Dr. Lady Gary for guidance on when to restart and at what dose to restart. I will write a prescription for potassium replacement and have them follow-up with Dr. Burnadette Pop.  ____________________________________________   FINAL CLINICAL IMPRESSION(S) / ED DIAGNOSES  Final diagnoses:  Laceration of leg, left, initial encounter  Blood blister  Excessive anticoagulation      Darien Ramus, MD 01/03/15 2105

## 2015-01-10 ENCOUNTER — Emergency Department: Payer: Medicare Other

## 2015-01-10 ENCOUNTER — Encounter: Payer: Self-pay | Admitting: Emergency Medicine

## 2015-01-10 ENCOUNTER — Inpatient Hospital Stay
Admission: EM | Admit: 2015-01-10 | Discharge: 2015-01-19 | DRG: 604 | Disposition: A | Discharge status: Home / Self Care | Payer: Medicare Other | Attending: Internal Medicine | Admitting: Internal Medicine

## 2015-01-10 DIAGNOSIS — E876 Hypokalemia: Secondary | ICD-10-CM | POA: Diagnosis not present

## 2015-01-10 DIAGNOSIS — Z87891 Personal history of nicotine dependence: Secondary | ICD-10-CM

## 2015-01-10 DIAGNOSIS — R0902 Hypoxemia: Secondary | ICD-10-CM

## 2015-01-10 DIAGNOSIS — S81802A Unspecified open wound, left lower leg, initial encounter: Principal | ICD-10-CM | POA: Diagnosis present

## 2015-01-10 DIAGNOSIS — R791 Abnormal coagulation profile: Secondary | ICD-10-CM | POA: Diagnosis present

## 2015-01-10 DIAGNOSIS — I482 Chronic atrial fibrillation: Secondary | ICD-10-CM | POA: Diagnosis present

## 2015-01-10 DIAGNOSIS — Z801 Family history of malignant neoplasm of trachea, bronchus and lung: Secondary | ICD-10-CM

## 2015-01-10 DIAGNOSIS — I5022 Chronic systolic (congestive) heart failure: Secondary | ICD-10-CM | POA: Diagnosis present

## 2015-01-10 DIAGNOSIS — Z825 Family history of asthma and other chronic lower respiratory diseases: Secondary | ICD-10-CM

## 2015-01-10 DIAGNOSIS — Z881 Allergy status to other antibiotic agents status: Secondary | ICD-10-CM | POA: Diagnosis not present

## 2015-01-10 DIAGNOSIS — D631 Anemia in chronic kidney disease: Secondary | ICD-10-CM | POA: Diagnosis present

## 2015-01-10 DIAGNOSIS — Z91040 Latex allergy status: Secondary | ICD-10-CM | POA: Diagnosis not present

## 2015-01-10 DIAGNOSIS — I071 Rheumatic tricuspid insufficiency: Secondary | ICD-10-CM | POA: Diagnosis present

## 2015-01-10 DIAGNOSIS — N183 Chronic kidney disease, stage 3 unspecified: Secondary | ICD-10-CM | POA: Insufficient documentation

## 2015-01-10 DIAGNOSIS — B964 Proteus (mirabilis) (morganii) as the cause of diseases classified elsewhere: Secondary | ICD-10-CM | POA: Diagnosis present

## 2015-01-10 DIAGNOSIS — M109 Gout, unspecified: Secondary | ICD-10-CM | POA: Diagnosis present

## 2015-01-10 DIAGNOSIS — N170 Acute kidney failure with tubular necrosis: Secondary | ICD-10-CM | POA: Diagnosis present

## 2015-01-10 DIAGNOSIS — B9620 Unspecified Escherichia coli [E. coli] as the cause of diseases classified elsewhere: Secondary | ICD-10-CM | POA: Diagnosis present

## 2015-01-10 DIAGNOSIS — Z7951 Long term (current) use of inhaled steroids: Secondary | ICD-10-CM | POA: Diagnosis not present

## 2015-01-10 DIAGNOSIS — I129 Hypertensive chronic kidney disease with stage 1 through stage 4 chronic kidney disease, or unspecified chronic kidney disease: Secondary | ICD-10-CM | POA: Diagnosis present

## 2015-01-10 DIAGNOSIS — J449 Chronic obstructive pulmonary disease, unspecified: Secondary | ICD-10-CM | POA: Diagnosis present

## 2015-01-10 DIAGNOSIS — N179 Acute kidney failure, unspecified: Secondary | ICD-10-CM | POA: Diagnosis present

## 2015-01-10 DIAGNOSIS — E875 Hyperkalemia: Secondary | ICD-10-CM | POA: Diagnosis present

## 2015-01-10 DIAGNOSIS — Z882 Allergy status to sulfonamides status: Secondary | ICD-10-CM | POA: Diagnosis not present

## 2015-01-10 DIAGNOSIS — I4891 Unspecified atrial fibrillation: Secondary | ICD-10-CM | POA: Diagnosis present

## 2015-01-10 DIAGNOSIS — I340 Nonrheumatic mitral (valve) insufficiency: Secondary | ICD-10-CM | POA: Diagnosis present

## 2015-01-10 DIAGNOSIS — D696 Thrombocytopenia, unspecified: Secondary | ICD-10-CM | POA: Diagnosis present

## 2015-01-10 DIAGNOSIS — J45909 Unspecified asthma, uncomplicated: Secondary | ICD-10-CM | POA: Diagnosis present

## 2015-01-10 DIAGNOSIS — T798XXA Other early complications of trauma, initial encounter: Secondary | ICD-10-CM | POA: Diagnosis not present

## 2015-01-10 DIAGNOSIS — L089 Local infection of the skin and subcutaneous tissue, unspecified: Secondary | ICD-10-CM | POA: Diagnosis present

## 2015-01-10 DIAGNOSIS — I429 Cardiomyopathy, unspecified: Secondary | ICD-10-CM | POA: Diagnosis present

## 2015-01-10 DIAGNOSIS — E871 Hypo-osmolality and hyponatremia: Secondary | ICD-10-CM | POA: Diagnosis present

## 2015-01-10 DIAGNOSIS — Z7901 Long term (current) use of anticoagulants: Secondary | ICD-10-CM | POA: Diagnosis not present

## 2015-01-10 DIAGNOSIS — B9562 Methicillin resistant Staphylococcus aureus infection as the cause of diseases classified elsewhere: Secondary | ICD-10-CM | POA: Diagnosis present

## 2015-01-10 DIAGNOSIS — I10 Essential (primary) hypertension: Secondary | ICD-10-CM | POA: Diagnosis present

## 2015-01-10 DIAGNOSIS — Z79899 Other long term (current) drug therapy: Secondary | ICD-10-CM | POA: Diagnosis not present

## 2015-01-10 DIAGNOSIS — N189 Chronic kidney disease, unspecified: Secondary | ICD-10-CM

## 2015-01-10 DIAGNOSIS — R0989 Other specified symptoms and signs involving the circulatory and respiratory systems: Secondary | ICD-10-CM

## 2015-01-10 HISTORY — DX: Rheumatic tricuspid insufficiency: I07.1

## 2015-01-10 HISTORY — DX: Nonrheumatic mitral (valve) insufficiency: I34.0

## 2015-01-10 HISTORY — DX: Nonrheumatic aortic (valve) insufficiency: I35.1

## 2015-01-10 HISTORY — DX: Chronic systolic (congestive) heart failure: I50.22

## 2015-01-10 LAB — CBC WITH DIFFERENTIAL/PLATELET
Basophils Absolute: 0 10*3/uL (ref 0–0.1)
Basophils Relative: 0 %
Eosinophils Absolute: 0 10*3/uL (ref 0–0.7)
Eosinophils Relative: 0 %
HCT: 29.4 % — ABNORMAL LOW (ref 35.0–47.0)
HEMOGLOBIN: 9.3 g/dL — AB (ref 12.0–16.0)
Lymphocytes Relative: 10 %
Lymphs Abs: 1 10*3/uL (ref 1.0–3.6)
MCH: 30.8 pg (ref 26.0–34.0)
MCHC: 31.6 g/dL — AB (ref 32.0–36.0)
MCV: 97.5 fL (ref 80.0–100.0)
MONO ABS: 0.8 10*3/uL (ref 0.2–0.9)
MONOS PCT: 9 %
Neutro Abs: 7.6 10*3/uL — ABNORMAL HIGH (ref 1.4–6.5)
Neutrophils Relative %: 81 %
Platelets: 100 10*3/uL — ABNORMAL LOW (ref 150–440)
RBC: 3.02 MIL/uL — ABNORMAL LOW (ref 3.80–5.20)
RDW: 18.6 % — AB (ref 11.5–14.5)
WBC: 9.5 10*3/uL (ref 3.6–11.0)

## 2015-01-10 LAB — PROTIME-INR
INR: 3.34
Prothrombin Time: 33.9 seconds — ABNORMAL HIGH (ref 11.4–15.0)

## 2015-01-10 MED ORDER — MORPHINE SULFATE 4 MG/ML IJ SOLN
4.0000 mg | Freq: Once | INTRAMUSCULAR | Status: AC
  Administered 2015-01-10: 4 mg via INTRAVENOUS

## 2015-01-10 MED ORDER — SODIUM POLYSTYRENE SULFONATE 15 GM/60ML PO SUSP
ORAL | Status: AC
  Administered 2015-01-10: 30 g via ORAL
  Filled 2015-01-10: qty 60

## 2015-01-10 MED ORDER — SODIUM CHLORIDE 0.9 % IV SOLN
Freq: Once | INTRAVENOUS | Status: AC
  Administered 2015-01-10: 20:00:00 via INTRAVENOUS

## 2015-01-10 MED ORDER — INSULIN ASPART 100 UNIT/ML ~~LOC~~ SOLN
SUBCUTANEOUS | Status: AC
  Filled 2015-01-10: qty 10

## 2015-01-10 MED ORDER — SODIUM CHLORIDE 0.9 % IV SOLN
1.0000 g | Freq: Once | INTRAVENOUS | Status: AC
  Administered 2015-01-10: 1 g via INTRAVENOUS
  Filled 2015-01-10: qty 10

## 2015-01-10 MED ORDER — SODIUM POLYSTYRENE SULFONATE 15 GM/60ML PO SUSP
30.0000 g | Freq: Once | ORAL | Status: AC
  Administered 2015-01-10: 30 g via ORAL

## 2015-01-10 MED ORDER — MORPHINE SULFATE 4 MG/ML IJ SOLN
INTRAMUSCULAR | Status: AC
  Administered 2015-01-10: 4 mg via INTRAVENOUS
  Filled 2015-01-10: qty 1

## 2015-01-10 MED ORDER — INSULIN ASPART 100 UNIT/ML ~~LOC~~ SOLN
SUBCUTANEOUS | Status: AC
  Administered 2015-01-10: 10 [IU] via INTRAVENOUS
  Filled 2015-01-10: qty 10

## 2015-01-10 MED ORDER — VANCOMYCIN HCL IN DEXTROSE 1-5 GM/200ML-% IV SOLN
INTRAVENOUS | Status: AC
  Administered 2015-01-10: 1000 mg via INTRAVENOUS
  Filled 2015-01-10: qty 200

## 2015-01-10 MED ORDER — INSULIN ASPART 100 UNIT/ML ~~LOC~~ SOLN
10.0000 [IU] | Freq: Once | SUBCUTANEOUS | Status: AC
  Administered 2015-01-10: 10 [IU] via INTRAVENOUS

## 2015-01-10 MED ORDER — DEXTROSE 50 % IV SOLN
25.0000 g | Freq: Once | INTRAVENOUS | Status: AC
  Administered 2015-01-10: 25 g via INTRAVENOUS

## 2015-01-10 MED ORDER — VANCOMYCIN HCL IN DEXTROSE 1-5 GM/200ML-% IV SOLN
1000.0000 mg | Freq: Once | INTRAVENOUS | Status: AC
  Administered 2015-01-10: 1000 mg via INTRAVENOUS

## 2015-01-10 MED ORDER — SODIUM POLYSTYRENE SULFONATE 15 GM/60ML PO SUSP
ORAL | Status: AC
  Filled 2015-01-10: qty 60

## 2015-01-10 MED ORDER — CALCIUM GLUCONATE 10 % IV SOLN
INTRAVENOUS | Status: AC
  Filled 2015-01-10: qty 10

## 2015-01-10 MED ORDER — DEXTROSE 50 % IV SOLN
INTRAVENOUS | Status: AC
  Administered 2015-01-10: 25 g via INTRAVENOUS
  Filled 2015-01-10: qty 50

## 2015-01-10 MED ORDER — MORPHINE SULFATE 4 MG/ML IJ SOLN
INTRAMUSCULAR | Status: AC
  Filled 2015-01-10: qty 1

## 2015-01-10 NOTE — ED Provider Notes (Signed)
[facility] Emergency Department Provider Note     Time seen: ----------------------------------------- 7:36 PM on 01/10/2015 -----------------------------------------  Level V caveat: Difficult history, mostly obtained from her son.  I have reviewed the triage vital signs and the nursing notes.   HISTORY  Chief Complaint Wound Check    HPI Alicia Trujillo is a 81 y.o. female brought the ER for wound check and significant wound drainage. Patient was seen recently for a wound to her left leg, rise with a foul-smelling odor to the wound and borderline hypotension. Son states blood pressure is normal for her is unsure of her color is normal. Patient complaining of significant significant pain to the left lower extremity. Denies any fevers or chills, son states she has had increased diuretics due to edema, states her swelling has improved.   Past Medical History  Diagnosis Date  . Asthma   . COPD (chronic obstructive pulmonary disease)   . A-fib   . Gout   . Essential hypertension   . Cardiomyopathy   . Chronic kidney disease     There are no active problems to display for this patient.   History reviewed. No pertinent past surgical history.  Allergies Latex; Levofloxacin; and Sulfa antibiotics  Social History History  Substance Use Topics  . Smoking status: Former Games developer  . Smokeless tobacco: Never Used  . Alcohol Use: No    Review of Systems Constitutional: Negative for fever. Eyes: Negative for visual changes. ENT: Negative for sore throat. Cardiovascular: Negative for chest pain. Respiratory: Negative for shortness of breath. Gastrointestinal: Negative for abdominal pain, vomiting and diarrhea. Genitourinary: Negative for dysuria. Musculoskeletal: Negative for back pain. Skin: Market skin drainage with odor to the left lower extremity Neurological: Negative for headaches, focal weakness or numbness.  10-point ROS otherwise  negative.  ____________________________________________   PHYSICAL EXAM:  VITAL SIGNS: ED Triage Vitals  Enc Vitals Group     BP 01/10/15 1926 89/47 mmHg     Pulse Rate 01/10/15 1926 96     Resp 01/10/15 1926 22     Temp 01/10/15 1926 97.6 F (36.4 C)     Temp Source 01/10/15 1926 Oral     SpO2 --      Weight 01/10/15 1926 110 lb (49.896 kg)     Height 01/10/15 1926  (1.626 m)     Head Cir --      Peak Flow --      Pain Score 01/10/15 1928 0     Pain Loc --      Pain Edu? --      Excl. in GC? --     Constitutional: Alert. Well appearing and in no distress. Eyes: Conjunctivae are normal. PERRL. Normal extraocular movements. ENT   Head: Normocephalic and atraumatic.   Nose: No congestion/rhinnorhea.   Mouth/Throat: Mucous membranes are moist.   Neck: No stridor. Cardiovascular: Normal rate, regular rhythm. Normal and symmetric distal pulses are present in all extremities. No murmurs, rubs, or gallops. Respiratory: Normal respiratory effort without tachypnea nor retractions. Breath sounds are clear and equal bilaterally. No wheezes/rales/rhonchi. Gastrointestinal: Soft and nontender. No distention.  Musculoskeletal: Nontender with normal range of motion in all extremities. No joint effusions.  Tenderness to the left lower extremity over this large wound Neurologic:  Normal speech and language. No gross focal neurologic deficits are appreciated. Speech is normal. No gait instability. Skin:  Extensive contusions, jaundice, large area of skin erosion noted anteriorly to to the left lower leg.  There i a large collection of quite a lot of blood in and around this wound Psychiatric: Mood and affect are normal. Speech and behavior are normal. Patient exhibits appropriate insight and judgment. ____________________________________________  EKG: Interpreted by me. Sinus rhythm with a rate of 80, left bundle branch block, left axis deviation, no evidence of acute  infarction  ____________________________________________  ED COURSE:  Pertinent labs & imaging results that were available during my care of the patient were reviewed by me and considered in my medical decision making (see chart for details). I gently brought at the left lower extremity wound after removing the dressings which smelled very foul. The Unna boot dressing was reapplied to the left lower extremity after wound culture was obtained. She was given IV vancomycin to cover for infection, we noticed that her potassium was markedly elevated and her kidney function had worsened. He is gently hydrated here. Will need admission to address her renal failure as well as this nonhealing wound to left lower extremity ____________________________________________    LABS (pertinent positives/negatives)  Labs Reviewed  CBC WITH DIFFERENTIAL/PLATELET - Abnormal; Notable for the following:    RBC 3.02 (*)    Hemoglobin 9.3 (*)    HCT 29.4 (*)    MCHC 31.6 (*)    RDW 18.6 (*)    Platelets 100 (*)    Neutro Abs 7.6 (*)    All other components within normal limits  COMPREHENSIVE METABOLIC PANEL - Abnormal; Notable for the following:    Sodium 131 (*)    Potassium 6.2 (*)    Chloride 91 (*)    Glucose, Bld 104 (*)    BUN 103 (*)    Creatinine, Ser 2.62 (*)    Calcium 8.6 (*)    AST 134 (*)    ALT 84 (*)    Total Bilirubin 1.5 (*)    GFR calc non Af Amer 15 (*)    GFR calc Af Amer 18 (*)    All other components within normal limits  PROTIME-INR - Abnormal; Notable for the following:    Prothrombin Time 33.9 (*)    All other components within normal limits  WOUND CULTURE    RADIOLOGY  IMPRESSION: Mild benignendosteal thickening mid tibia. No erosive change or bony destruction. No abnormal periosteal reaction. No fracture or dislocation. No soft tissue air seen.  CRITICAL CARE Performed by: Emily Filbert   Total critical care time: 30 minutes  Critical care time was  exclusive of separately billable procedures and treating other patients.  Critical care was necessary to treat or prevent imminent or life-threatening deterioration.  Critical care was time spent personally by me on the following activities: development of treatment plan with patient and/or surrogate as well as nursing, discussions with consultants, evaluation of patient's response to treatment, examination of patient, obtaining history from patient or surrogate, ordering and performing treatments and interventions, ordering and review of laboratory studies, ordering and review of radiographic studies, pulse oximetry and re-evaluation of patient's condition.  ____________________________________________  FINAL ASSESSMENT AND PLAN  Acute renal failure, hyperkalemia, left lower extremity wound infection  Plan: Patient will continue on antibiotics, dressing changes and gentle hydration. This is likely over diuresed this or decreased by mouth intake related. She is stable for hospital admission at this time.   Emily Filbert, MD   Emily Filbert, MD 01/10/15 2214

## 2015-01-10 NOTE — ED Notes (Signed)
Assumed pt care.

## 2015-01-10 NOTE — ED Notes (Signed)
Pt was seen last night for a blood blister to her lft leg, pt arrives today with foul smelling odor to the wound, pt is noted to be jaundiced at triage assessment and hypotensive, son unable to state if color is normal for her, states that he hasn't noticed

## 2015-01-10 NOTE — H&P (Signed)
[facility] - [place] [facility]   PATIENT NAME: Alicia Trujillo    MR#:  [id number]  DATE OF BIRTH:  12/03/1924  DATE OF ADMISSION:  01/10/2015  PRIMARY CARE PHYSICIAN: Marisue Ivan, MD   REQUESTING/REFERRING PHYSICIAN: Mayford Knife  CHIEF COMPLAINT:   Chief Complaint  Patient presents with  . Wound Check    HISTORY OF PRESENT ILLNESS:  Alicia Trujillo  is a 81 y.o. female who presents with bleeding hematoma and left lower extremity wound infection. This is a patient who is on chronic anticoagulation for atrial fibrillation, and has had fluctuating INR of the last several weeks. Most notably she's been more often supratherapeutic. She developed a hematoma in her left lower extremity, and then, per her son who is in the room with her at the time of interview, "bumped" the wound causing it to rupture. He brought her in tonight because he was worried that she started to develop an infection in that area as her skin surrounding the wound has become more erythematous. Of note she has chronic systolic congestive heart failure with an EF of 20% on last echo late 2015, and per her son has recently been receiving increasing doses of diuretics in order to manage her lower extremity edema. Subsequent to this, in the ED today she was found to have a significantly elevated serum creatinine. Hospitalists were called for admission for acute on chronic kidney failure, left lower extremity wound infection, and supratherapeutic INR.   PAST MEDICAL HISTORY:   Past Medical History  Diagnosis Date  . Asthma   . COPD (chronic obstructive pulmonary disease)   . A-fib   . Gout   . Essential hypertension   . Cardiomyopathy   . Chronic kidney disease   . Chronic systolic congestive heart failure   . Severe mitral regurgitation   . Mild aortic regurgitation   . Severe tricuspid regurgitation     PAST SURGICAL HISTORY:   Past Surgical History  Procedure Laterality Date  .  Abdominal hysterectomy    . Bladder surgery      SOCIAL HISTORY:   History  Substance Use Topics  . Smoking status: Former Games developer  . Smokeless tobacco: Never Used  . Alcohol Use: No    FAMILY HISTORY:   Family History  Problem Relation Age of Onset  . Asthma Father   . Lung cancer Brother     DRUG ALLERGIES:   Allergies  Allergen Reactions  . Latex Rash  . Levofloxacin Rash  . Sulfa Antibiotics Rash    MEDICATIONS AT HOME:   Prior to Admission medications   Medication Sig Start Date End Date Taking? Authorizing Provider  albuterol (PROAIR HFA) 108 (90 BASE) MCG/ACT inhaler Inhale 2 puffs into the lungs every 4 (four) hours as needed. 12/14/14  Yes Historical Provider, MD  allopurinol (ZYLOPRIM) 100 MG tablet Take 100 mg by mouth daily. 10/06/14  Yes Historical Provider, MD  calcium-vitamin D (CALCIUM 500/D) 500-200 MG-UNIT per tablet Take 1 tablet by mouth daily.   Yes Historical Provider, MD  fexofenadine (ALLEGRA) 180 MG tablet Take 180 mg by mouth daily.   Yes Historical Provider, MD  Fluticasone-Salmeterol (ADVAIR DISKUS) 250-50 MCG/DOSE AEPB Inhale 1 puff into the lungs every 12 (twelve) hours. 07/08/14 07/08/15 Yes Historical Provider, MD  furosemide (LASIX) 40 MG tablet Take 40 mg by mouth 2 (two) times daily as needed. 10/05/14  Yes Historical Provider, MD  metolazone (ZAROXOLYN) 2.5 MG tablet Take 2.5 mg by mouth daily. Stop  if BP drops below 90/60. 11/20/14 11/20/15 Yes Historical Provider, MD  metoprolol tartrate (LOPRESSOR) 25 MG tablet Take 12.5 mg by mouth 2 (two) times daily. 11/17/14 11/17/15 Yes Historical Provider, MD  montelukast (SINGULAIR) 10 MG tablet Take 10 mg by mouth daily. 11/18/14  Yes Historical Provider, MD  Multiple Vitamin (MULTI-VITAMINS) TABS Take 1 tablet by mouth daily.   Yes Historical Provider, MD  potassium chloride (K-DUR) 10 MEQ tablet Take 2 tablets (20 mEq total) by mouth 2 (two) times daily. 01/03/15  Yes Darien Ramus, MD   spironolactone (ALDACTONE) 25 MG tablet Take 25 mg by mouth daily. 12/31/14 12/31/15 Yes Historical Provider, MD  warfarin (COUMADIN) 2 MG tablet Take 2 mg by mouth daily. 11/11/14  Yes Historical Provider, MD    REVIEW OF SYSTEMS:  Review of Systems  Constitutional: Negative for fever, chills, weight loss and malaise/fatigue.  HENT: Positive for hearing loss (chronic stable). Negative for ear pain and tinnitus.   Eyes: Negative for blurred vision, double vision, pain and redness.  Respiratory: Negative for cough, hemoptysis and shortness of breath.   Cardiovascular: Positive for leg swelling. Negative for chest pain, palpitations and orthopnea.  Gastrointestinal: Negative for nausea, vomiting, abdominal pain, diarrhea and constipation.  Genitourinary: Negative for dysuria, frequency and hematuria.  Musculoskeletal: Negative for back pain, joint pain and neck pain.       Left lower extremity wound.  Skin:       Left lower extremity bleeding wound with surrounding erythema. No acne, rash, or lesions  Neurological: Negative for dizziness, tremors, focal weakness and weakness.  Endo/Heme/Allergies: Negative for polydipsia. Does not bruise/bleed easily.  Psychiatric/Behavioral: Negative for depression. The patient is not nervous/anxious and does not have insomnia.      VITAL SIGNS:   Filed Vitals:   01/10/15 1926 01/10/15 2116 01/10/15 2342  BP: 89/47 93/57 100/75  Pulse: 96 80 79  Temp: 97.6 F (36.4 C)    TempSrc: Oral    Resp: 22 20 15   Height: 5\' 4"  (1.626 m)    Weight: 49.896 kg (110 lb)    SpO2:  100% 100%   Wt Readings from Last 3 Encounters:  01/10/15 49.896 kg (110 lb)  01/03/15 51.483 kg (113 lb 8 oz)    PHYSICAL EXAMINATION:  Physical Exam  Constitutional: She is oriented to person, place, and time. She appears well-developed and well-nourished. No distress.  HENT:  Head: Normocephalic and atraumatic.  Mouth/Throat: Oropharynx is clear and moist.  Heart of hearing,  chronic problem  Eyes: Conjunctivae and EOM are normal. Pupils are equal, round, and reactive to light. No scleral icterus.  Neck: Normal range of motion. Neck supple. No JVD present. No thyromegaly present.  Cardiovascular: Normal rate and intact distal pulses.  Exam reveals no gallop and no friction rub.   Murmur (3/6 systolic murmur) heard. Irregular  Respiratory: Effort normal and breath sounds normal. No respiratory distress. She has no wheezes. She has no rales.  GI: Soft. Bowel sounds are normal. She exhibits no distension. There is no tenderness.  Musculoskeletal: Normal range of motion. She exhibits no edema.  Left lower extremity bleeding wound with surrounding erythema. No arthritis, no gout  Lymphadenopathy:    She has no cervical adenopathy.  Neurological: She is alert and oriented to person, place, and time. No cranial nerve deficit.  No dysarthria, no aphasia  Skin: Skin is warm and dry. No rash noted. There is erythema.  Surrounding the left lower extremity bleeding  Psychiatric: She  has a normal mood and affect. Her behavior is normal. Judgment and thought content normal.    LABORATORY PANEL:   CBC  Recent Labs Lab 01/10/15 1947  WBC 9.5  HGB 9.3*  HCT 29.4*  PLT 100*   ------------------------------------------------------------------------------------------------------------------  Chemistries   Recent Labs Lab 01/10/15 1947  NA 131*  K 6.2*  CL 91*  CO2 30  GLUCOSE 104*  BUN 103*  CREATININE 2.62*  CALCIUM 8.6*  AST 134*  ALT 84*  ALKPHOS 119  BILITOT 1.5*   ------------------------------------------------------------------------------------------------------------------  Cardiac Enzymes No results for input(s): TROPONINI in the last 168 hours. ------------------------------------------------------------------------------------------------------------------  RADIOLOGY:  Dg Tibia/fibula Left  01/10/2015   CLINICAL DATA:  Anterior wound  with bruising and swelling  EXAM: LEFT TIBIA AND FIBULA - 2 VIEW  COMPARISON:  None.  FINDINGS: Frontal and lateral views were obtained. There is a bandage over the mid tibial region medially. There is no fracture or dislocation. No erosive change or bony destruction. There is mild benign-appearing endosteal thickening along the anterior and medial mid tibia, likely reactive due to trauma in this area.  IMPRESSION: Mild benignendosteal thickening mid tibia. No erosive change or bony destruction. No abnormal periosteal reaction. No fracture or dislocation. No soft tissue air seen.   Electronically Signed   By: Bretta Bang III M.D.   On: 01/10/2015 20:29    EKG:   Orders placed or performed in visit on 10/10/13  . EKG 12-Lead    IMPRESSION AND PLAN:  Principal Problem:   Acute on chronic kidney failure - per her son these problems have been going on for the last 3-4 weeks. Unclear if her diuretic medication changes contributed to her fluctuating INR. The time course for both is coincident. We'll hold diuretics for now. Given her EF of 20%, we will only give her very gentle fluids. Monitor serum creatinine, and if not improving over the next day or so we'll get nephrology consult. Active Problems:   Wound of left lower extremity - given vancomycin in the ED, continue this per pharmacy consult with close monitoring given her renal function. Continue Radio broadcast assistant.   Elevated INR - hold warfarin for one dose, trend INR, warfarin per pharmacy dosing.   Atrial fibrillation - anticoagulation as above, continue home dose rate controlling medications.   Chronic systolic congestive heart failure - close monitoring, not in acute exacerbation at this time, holding diuretics for now given renal failure, consider cardiology consult, though this is not urgent.   Essential hypertension - continue home medications   COPD (chronic obstructive pulmonary disease) - continue home inhalers   Gout - continue  allopurinol for now, despite renal function, to avoid exacerbating an acute gout attack. Dose adjust for renal function.   All the records are reviewed and case discussed with ED provider. Management plans discussed with the patient and/or family.  DVT PROPHYLAXIS: systemic anticoagulation  ADMISSION STATUS: Inpatient  CODE STATUS: Full Advance Directive Documentation        Most Recent Value   Type of Advance Directive  Healthcare Power of Attorney   Pre-existing out of facility DNR order (yellow form or pink MOST form)     "MOST" Form in Place?        TOTAL TIME TAKING CARE OF THIS PATIENT: 45 minutes.    Anitria Andon FIELDING 01/10/2015, 11:50 PM  [facility]  Office  [phone]  CC: Primary care physician; Marisue Ivan, MD

## 2015-01-11 ENCOUNTER — Inpatient Hospital Stay: Payer: Medicare Other

## 2015-01-11 LAB — CBC
HCT: 27.1 % — ABNORMAL LOW (ref 35.0–47.0)
Hemoglobin: 8.7 g/dL — ABNORMAL LOW (ref 12.0–16.0)
MCH: 31.4 pg (ref 26.0–34.0)
MCHC: 32.1 g/dL (ref 32.0–36.0)
MCV: 97.7 fL (ref 80.0–100.0)
PLATELETS: 77 10*3/uL — AB (ref 150–440)
RBC: 2.77 MIL/uL — ABNORMAL LOW (ref 3.80–5.20)
RDW: 18.7 % — ABNORMAL HIGH (ref 11.5–14.5)
WBC: 6 10*3/uL (ref 3.6–11.0)

## 2015-01-11 LAB — COMPREHENSIVE METABOLIC PANEL
ALBUMIN: 3.6 g/dL (ref 3.5–5.0)
ALT: 84 U/L — AB (ref 14–54)
ANION GAP: 10 (ref 5–15)
AST: 134 U/L — AB (ref 15–41)
Alkaline Phosphatase: 119 U/L (ref 38–126)
BUN: 103 mg/dL — ABNORMAL HIGH (ref 6–20)
CO2: 30 mmol/L (ref 22–32)
Calcium: 8.6 mg/dL — ABNORMAL LOW (ref 8.9–10.3)
Chloride: 91 mmol/L — ABNORMAL LOW (ref 101–111)
Creatinine, Ser: 2.62 mg/dL — ABNORMAL HIGH (ref 0.44–1.00)
GFR calc Af Amer: 18 mL/min — ABNORMAL LOW (ref >60)
GFR calc non Af Amer: 15 mL/min — ABNORMAL LOW (ref >60)
Glucose, Bld: 104 mg/dL — ABNORMAL HIGH (ref 65–99)
Potassium: 6.2 mmol/L — ABNORMAL HIGH (ref 3.5–5.1)
Sodium: 131 mmol/L — ABNORMAL LOW (ref 135–145)
Total Bilirubin: 1.5 mg/dL — ABNORMAL HIGH (ref 0.3–1.2)
Total Protein: 6.8 g/dL (ref 6.5–8.1)

## 2015-01-11 LAB — BASIC METABOLIC PANEL
ANION GAP: 5 (ref 5–15)
BUN: 98 mg/dL — ABNORMAL HIGH (ref 6–20)
CALCIUM: 9 mg/dL (ref 8.9–10.3)
CO2: 34 mmol/L — ABNORMAL HIGH (ref 22–32)
CREATININE: 2.29 mg/dL — AB (ref 0.44–1.00)
Chloride: 98 mmol/L — ABNORMAL LOW (ref 101–111)
GFR calc Af Amer: 21 mL/min — ABNORMAL LOW (ref >60)
GFR calc non Af Amer: 18 mL/min — ABNORMAL LOW (ref >60)
GLUCOSE: 83 mg/dL (ref 65–99)
POTASSIUM: 4.3 mmol/L (ref 3.5–5.1)
Sodium: 137 mmol/L (ref 135–145)

## 2015-01-11 LAB — PROTIME-INR
INR: 3.14
Prothrombin Time: 32.3 seconds — ABNORMAL HIGH (ref 11.4–15.0)

## 2015-01-11 MED ORDER — SODIUM CHLORIDE 0.9 % IV SOLN
INTRAVENOUS | Status: AC
  Administered 2015-01-11: 06:00:00 via INTRAVENOUS

## 2015-01-11 MED ORDER — ALBUTEROL SULFATE (2.5 MG/3ML) 0.083% IN NEBU
3.0000 mL | INHALATION_SOLUTION | RESPIRATORY_TRACT | Status: DC | PRN
  Filled 2015-01-11: qty 3

## 2015-01-11 MED ORDER — MONTELUKAST SODIUM 10 MG PO TABS
10.0000 mg | ORAL_TABLET | Freq: Every day | ORAL | Status: DC
  Administered 2015-01-11 – 2015-01-19 (×9): 10 mg via ORAL
  Filled 2015-01-11 (×9): qty 1

## 2015-01-11 MED ORDER — HYDROCODONE-ACETAMINOPHEN 5-325 MG PO TABS
1.0000 | ORAL_TABLET | ORAL | Status: DC | PRN
  Filled 2015-01-11: qty 1

## 2015-01-11 MED ORDER — SODIUM CHLORIDE 0.9 % IV BOLUS (SEPSIS)
250.0000 mL | Freq: Once | INTRAVENOUS | Status: AC
  Administered 2015-01-11: 250 mL via INTRAVENOUS

## 2015-01-11 MED ORDER — WARFARIN SODIUM 2 MG PO TABS: 2.0000 mg | ORAL_TABLET | Freq: Every day | ORAL | Status: DC

## 2015-01-11 MED ORDER — VANCOMYCIN HCL IN DEXTROSE 750-5 MG/150ML-% IV SOLN
750.0000 mg | INTRAVENOUS | Status: DC
  Administered 2015-01-12: 750 mg via INTRAVENOUS
  Filled 2015-01-11: qty 150

## 2015-01-11 MED ORDER — METOPROLOL TARTRATE 25 MG PO TABS
12.5000 mg | ORAL_TABLET | Freq: Two times a day (BID) | ORAL | Status: DC
  Administered 2015-01-11: 12.5 mg via ORAL
  Filled 2015-01-11 (×2): qty 1

## 2015-01-11 MED ORDER — SODIUM CHLORIDE 0.9 % IJ SOLN
3.0000 mL | Freq: Two times a day (BID) | INTRAMUSCULAR | Status: DC
  Administered 2015-01-15 – 2015-01-19 (×9): 3 mL via INTRAVENOUS

## 2015-01-11 MED ORDER — SENNOSIDES-DOCUSATE SODIUM 8.6-50 MG PO TABS: 1.0000 | ORAL_TABLET | Freq: Every evening | ORAL | Status: DC | PRN

## 2015-01-11 MED ORDER — ACETAMINOPHEN 325 MG PO TABS
650.0000 mg | ORAL_TABLET | Freq: Four times a day (QID) | ORAL | Status: DC | PRN
  Administered 2015-01-12 – 2015-01-19 (×6): 650 mg via ORAL
  Filled 2015-01-11 (×6): qty 2

## 2015-01-11 MED ORDER — VANCOMYCIN HCL IN DEXTROSE 1-5 GM/200ML-% IV SOLN: 1000.0000 mg | Freq: Once | INTRAVENOUS | Status: DC

## 2015-01-11 MED ORDER — ALLOPURINOL 100 MG PO TABS
100.0000 mg | ORAL_TABLET | Freq: Every day | ORAL | Status: DC
  Administered 2015-01-11 – 2015-01-19 (×9): 100 mg via ORAL
  Filled 2015-01-11 (×9): qty 1

## 2015-01-11 MED ORDER — SODIUM CHLORIDE 0.9 % IV SOLN
INTRAVENOUS | Status: DC
  Administered 2015-01-11 – 2015-01-14 (×3): via INTRAVENOUS

## 2015-01-11 MED ORDER — MOMETASONE FURO-FORMOTEROL FUM 100-5 MCG/ACT IN AERO
2.0000 | INHALATION_SPRAY | Freq: Two times a day (BID) | RESPIRATORY_TRACT | Status: DC
  Administered 2015-01-11 – 2015-01-19 (×16): 2 via RESPIRATORY_TRACT
  Filled 2015-01-11 (×2): qty 8.8

## 2015-01-11 MED ORDER — VANCOMYCIN HCL IN DEXTROSE 1-5 GM/200ML-% IV SOLN: 1000.0000 mg | INTRAVENOUS | Status: DC

## 2015-01-11 MED ORDER — ACETAMINOPHEN 650 MG RE SUPP: 650.0000 mg | Freq: Four times a day (QID) | RECTAL | Status: DC | PRN

## 2015-01-11 MED ORDER — COLLAGENASE 250 UNIT/GM EX OINT
TOPICAL_OINTMENT | Freq: Every day | CUTANEOUS | Status: DC
  Administered 2015-01-11 – 2015-01-15 (×5): via TOPICAL
  Administered 2015-01-16: 1 via TOPICAL
  Administered 2015-01-17 – 2015-01-19 (×3): via TOPICAL
  Filled 2015-01-11: qty 30

## 2015-01-11 NOTE — Progress Notes (Signed)
ANTICOAGULATION CONSULT NOTE - Initial Consult  Pharmacy Consult for warfarin dosing Indication: atrial fibrillation  Allergies  Allergen Reactions  . Latex Rash  . Levofloxacin Rash  . Sulfa Antibiotics Rash    Patient Measurements: Height: 5\' 4"  (162.6 cm) Weight: 110 lb (49.896 kg) IBW/kg (Calculated) : 54.7 Heparin Dosing Weight: n/a  Vital Signs: Temp: 97.6 F (36.4 C) (06/19 1926) Temp Source: Oral (06/19 1926) BP: 96/75 mmHg (06/20 0050) Pulse Rate: 80 (06/20 0050)  Labs:  Recent Labs  01/10/15 1947  HGB 9.3*  HCT 29.4*  PLT 100*  LABPROT 33.9*  INR 3.34  CREATININE 2.62*    Estimated Creatinine Clearance: 11.9 mL/min (by C-G formula based on Cr of 2.62).   Medical History: Past Medical History  Diagnosis Date  . Asthma   . COPD (chronic obstructive pulmonary disease)   . A-fib   . Gout   . Essential hypertension   . Cardiomyopathy   . Chronic kidney disease   . Chronic systolic congestive heart failure   . Severe mitral regurgitation   . Mild aortic regurgitation   . Severe tricuspid regurgitation     Medications:    Assessment: INR 3.34  Goal of Therapy:  INR 2-3   Plan:    2 mg daily ordered to start 6/21. INR ordered for 6/21 AM.   Fulton Reek, PharmD, BCPS  01/11/2015

## 2015-01-11 NOTE — Progress Notes (Signed)
PT Cancellation Note  Patient Details Name: Alicia Trujillo MRN: [id number] DOB: August 02, 1924   Cancelled Treatment:    Reason Eval/Treat Not Completed: Patient at procedure or test/unavailable (Patient currently with wound care nurse for dressing change. Will re-attempt in PM as patient available and medically appropriate.)   [name] H. Manson Passey, PT, DPT, NCS 01/11/2015, 11:59 AM [phone]

## 2015-01-11 NOTE — Care Management Note (Addendum)
Case Management Note  Patient Details  Name: Alicia Trujillo MRN: [id number] Date of Birth: 04/06/1925  Subjective/Objective:       81yo Alicia Alicia Trujillo was admitted 01/10/15 per an infected lower leg hematoma. Very HOH. [name] resides with her son who reports that she "bumped her shin in the shower a few weeks ago, a large blood blister appeared and now it has a bad odor to it." Son Alicia Trujillo reports that Alicia Trujillo has only one step into the house and no inside stairs. She has a rolling walker, cane, BSC, and shower chair at home. PCP= Dr Burnadette Pop. Pharmacy=[facility] on [address].  Alicia Trujillo received rehab at [facility] a year ago, and received home health services from [facility] after that. Son requests [facility] if home health is ordered after discharge. Potential referral called to [facility] today.                     Expected Discharge Date:                  Expected Discharge Plan:  Home/Self Care  In-House Referral:     Discharge planning Services     Post Acute Care Choice:    Choice offered to:  Patient, Adult Children  DME Arranged:  N/A DME Agency:     HH Arranged:    HH Agency:  [facility]  Status of Service:  In process, will continue to follow  Medicare Important Message Given:  Yes Date Medicare IM Given:  01/11/15 Medicare IM give by:   Kelton Pillar) Date Additional Medicare IM Given:    Additional Medicare Important Message give by:     If discussed at Long Length of Stay Meetings, dates discussed:    Additional Comments:  01/12/15: Spoke with with patient's son Alicia Trujillo and he confirmed that he still wants patient to have HHPT and HHRN.Alicia Trujillo RNCM. 01/13/15 11:01AM: Spoke again with patient's son Alicia Trujillo [phone]. O2 is new for this patient. Qualifying sats needed which are good for 48 hours from discharge date.    Rockett,Marilyn A, RN 01/11/2015, 9:59 AM

## 2015-01-11 NOTE — Progress Notes (Signed)
Pt son stated that this patient did not fall . Pt son reported that she scraped her leg on the side of the bathttub. Denies any fall in 6 months

## 2015-01-11 NOTE — Progress Notes (Signed)
ANTIBIOTIC CONSULT NOTE - INITIAL  Pharmacy Consult for vancomycin dosing Indication: wound infection  Allergies  Allergen Reactions  . Latex Rash  . Levofloxacin Rash  . Sulfa Antibiotics Rash    Patient Measurements: Height: 5\' 4"  (162.6 cm) Weight: 110 lb (49.896 kg) IBW/kg (Calculated) : 54.7 Adjusted Body Weight: 49.9  Vital Signs: Temp: 97.6 F (36.4 C) (06/19 1926) Temp Source: Oral (06/19 1926) BP: 96/75 mmHg (06/20 0050) Pulse Rate: 80 (06/20 0050) Intake/Output from previous day:   Intake/Output from this shift:    Labs:  Recent Labs  01/10/15 1947  WBC 9.5  HGB 9.3*  PLT 100*  CREATININE 2.62*   Estimated Creatinine Clearance: 11.9 mL/min (by C-G formula based on Cr of 2.62). No results for input(Trujillo): VANCOTROUGH, VANCOPEAK, VANCORANDOM, GENTTROUGH, GENTPEAK, GENTRANDOM, TOBRATROUGH, TOBRAPEAK, TOBRARND, AMIKACINPEAK, AMIKACINTROU, AMIKACIN in the last 72 hours.   Microbiology: No results found for this or any previous visit (from the past 720 hour(Trujillo)).  Medical History: Past Medical History  Diagnosis Date  . Asthma   . COPD (chronic obstructive pulmonary disease)   . A-fib   . Gout   . Essential hypertension   . Cardiomyopathy   . Chronic kidney disease   . Chronic systolic congestive heart failure   . Severe mitral regurgitation   . Mild aortic regurgitation   . Severe tricuspid regurgitation     Medications:   Assessment: Wound cx pending  Goal of Therapy:  Vancomycin trough level 15-20 mcg/ml  Plan:  TBW 49.9kg  DW 49.9kg  Vd 35L kei 0.014 hr-1  T1/2 50 hours. 1 gram in ED. 1 gram q 72 hours ordered to follow with stacked dosing. Level before 3rd dose.   Alicia Trujillo 01/11/2015,2:04 AM

## 2015-01-11 NOTE — Progress Notes (Signed)
PT Cancellation Note  Patient Details Name: Alicia Trujillo MRN: [id number] DOB: 03-Dec-1924   Cancelled Treatment:    Reason Eval/Treat Not Completed: Patient at procedure or test/unavailable (Evaluation re-attempted.  Patient now off unit for renal ultrasound.  Will re-attempt at later time/date as patient available and medically appropriate.)   Candiace West H. Manson Passey, PT, DPT, NCS 01/11/2015, 1:05 PM [phone]

## 2015-01-11 NOTE — Evaluation (Signed)
Physical Therapy Evaluation Patient Details Name: Alicia Trujillo MRN: [id number] DOB: 07-28-1924 Today's Date: 01/11/2015   History of Present Illness  presented to ER from home secondary to L LE injury/drainage with foul odor; admitted with L LE infection/wound and acute kidney failure.  Clinical Impression  Upon evaluation, patient pleasant and cooperative; exceptionally HOH.  Requires hand gestures for optimal command following/comprehension.  Strength and ROM generally WFL for basic transfers and mobility, at least 3+ to 4/5.  Large, draining hematoma to anterior/medial aspect of L LE (covered with dressing).  Question sensory integrity of bilat LEs, but patient unable to hear for formal testing.  Able to complete bed mobility, sit/stand, basic transfers and short-distance gait with RW, cga.  Slow, but fairly steady, without overt LOB; requires UE support at all times.  No increase in L LE pain with mobility. Would benefit from skilled PT to address above deficits and promote optimal return to PLOF;Recommend transition to HHPT upon discharge from acute hospitalization (provided 24 hour sup/assist is available from son).    Follow Up Recommendations Home health PT    Equipment Recommendations       Recommendations for Other Services       Precautions / Restrictions Precautions Precautions: Fall Restrictions Weight Bearing Restrictions: No      Mobility  Bed Mobility Overal bed mobility: Needs Assistance Bed Mobility: Supine to Sit     Supine to sit: Min guard        Transfers Overall transfer level: Needs assistance Equipment used: Rolling walker (2 wheeled) Transfers: Sit to/from Stand Sit to Stand: Min guard         General transfer comment: gestures for hand placement  Ambulation/Gait Ambulation/Gait assistance: Min guard Ambulation Distance (Feet): 5 Feet Assistive device: Rolling walker (2 wheeled)     Gait velocity interpretation: <1.8 ft/sec,  indicative of risk for recurrent falls General Gait Details: 3 point, step to gait pattern wtih forward flexed posture; slow, but fairly steady, without overt LOB.  Additional distance deferred as patient lunch tray arrived  Stairs            Wheelchair Mobility    Modified Rankin (Stroke Patients Only)       Balance Overall balance assessment: Needs assistance Sitting-balance support: Feet supported Sitting balance-Leahy Scale: Good     Standing balance support: Bilateral upper extremity supported Standing balance-Leahy Scale: Fair                               Pertinent Vitals/Pain Pain Assessment: No/denies pain    Home Living Family/patient expects to be discharged to:: Private residence                 Additional Comments: patient very HOH and able to provide very limited social history; son present, but stepped out of room during evaluation.  Will verify social history and PLOF with family as available.  Per care management, patient lives with son in single-story home with 1 step to enter; ambulatory with RW; son able to provide 24 hour sup.    Prior Function           Comments: appears to typicall utilize RW with mobility outside of hospital environment     Hand Dominance        Extremity/Trunk Assessment   Upper Extremity Assessment: Overall WFL for tasks assessed           Lower Extremity Assessment: Overall  WFL for tasks assessed [facility] for basic transfers/mobility; question integrity of full sensory awareness of bilat LEs)         Communication   Communication: HOH (EXTREMELY HOH)  Cognition Arousal/Alertness: Awake/alert Behavior During Therapy: WFL for tasks assessed/performed Overall Cognitive Status: Difficult to assess (due to [facility]; appears to follow gestures appropriately)                      General Comments General comments (skin integrity, edema, etc.): large hematoma to anterior/medial aspect of L LE,  occasional serosanguinous drainage noted; covered dressing to L heel    Exercises Other Exercises Other Exercises: Toilet transfer with RW, cga; sit/stand from bedside commode, cga with RW; standing balance for clothing management, cga with RW.  Requires UE support with all movement transitions and standing activities (8 minutes)      Assessment/Plan    PT Assessment Patient needs continued PT services  PT Diagnosis Difficulty walking;Generalized weakness   PT Problem List Decreased strength;Decreased activity tolerance;Decreased balance;Decreased mobility;Decreased knowledge of use of DME;Decreased safety awareness;Decreased knowledge of precautions;Impaired sensation;Decreased skin integrity  PT Treatment Interventions DME instruction;Gait training;Stair training;Functional mobility training;Therapeutic activities;Therapeutic exercise;Balance training;Patient/family education   PT Goals (Current goals can be found in the Care Plan section) Acute Rehab PT Goals Patient Stated Goal: "to go to the bathroom" PT Goal Formulation: With patient Time For Goal Achievement: 01/25/15 Potential to Achieve Goals: Good    Frequency Min 2X/week   Barriers to discharge        Co-evaluation               End of Session Equipment Utilized During Treatment: Gait belt Activity Tolerance: Patient tolerated treatment well Patient left: in chair;with call bell/phone within reach;with chair alarm set           Time: 1655-3748 PT Time Calculation (min) (ACUTE ONLY): 22 min   Charges:   PT Evaluation $Initial PT Evaluation Tier I: 1 Procedure PT Treatments $Therapeutic Activity: 8-22 mins   PT G Codes:        Deanna Wiater H. Manson Passey, PT, DPT, NCS 01/11/2015, 1:52 PM [phone]

## 2015-01-11 NOTE — Consult Note (Addendum)
WOC wound consult note Reason for Consult: Consult requested for left leg wound.  Patient's son at bedside states she bumped it against a bathtub last week and it became a "large blood-filled blister which opened and drained a lot of blood"  This week it became increasingly red and had a strong odor and some drainage. She was due to have an initial appointment at the outpatient wound care center tomorrow but he will cancel this since she has been admitted. Wound type: Full thickness wound to left calf.  14X6X.1cm; 100% dark red old blood which has evolved into eschar in patchy areas. There is a small puncture wound to left inner ankle; .1X.1X.1cm  with small amt red drainage, no odor. Drainage (amount, consistency, odor) Small amt green drainage and some strong odor noted when previous dressing was removed. Periwound: Generalized dark red erythremia to bilat legs; appearance consistent with venous stasis changes.  Son states she had another wound in the past which took a"long time to heal and she wore the tight stockings to prevent swelling." He states the previous edema and erythremia to left leg has receeded since admission.  Dressing procedure/placement/frequency: Santyl ointment to chemically debride nonviable tissue.  Ace wrap for light compression.  Pt should resume the plan for follow-up with the outpatient wound care center after discharge; she will probably require sharp debridement to assist with removal of nonviable tissue once it begins to soften.  Discussed plan of care with son at the bedside and he verbalizes understanding. Pt could benefit from follow-up with home health nursing for dressing changes after discharge, please order if desired. Please re-consult if further assistance is needed.  Thank-you,  Cammie Mcgee MSN, RN, CWOCN, [place], CNS [phone]

## 2015-01-11 NOTE — Progress Notes (Addendum)
[facility] Physicians - [place] at [facility]   PATIENT NAME: Alicia Trujillo    MR#:  [id number]  DATE OF BIRTH:  12-Jun-1925  SUBJECTIVE:  CHIEF COMPLAINT:   Chief Complaint  Patient presents with  . Wound Check   generalized weakness and feels thirsty.  REVIEW OF SYSTEMS:  CONSTITUTIONAL: No fever, generalized weakness.  EYES: No blurred or double vision.  EARS, NOSE, AND THROAT: No tinnitus or ear pain. Hard hearing. RESPIRATORY: No cough, shortness of breath, wheezing or hemoptysis.  CARDIOVASCULAR: No chest pain, orthopnea, edema.  GASTROINTESTINAL: No nausea, vomiting, diarrhea or abdominal pain.  GENITOURINARY: No dysuria, hematuria.  ENDOCRINE: No polyuria, nocturia,  HEMATOLOGY: No anemia, easy bruising or bleeding SKIN: No rash or lesion.  MUSCULOSKELETAL: No joint pain or arthritis.   NEUROLOGIC: No tingling, numbness, weakness.  PSYCHIATRY: No anxiety or depression.   DRUG ALLERGIES:   Allergies  Allergen Reactions  . Latex Rash  . Levofloxacin Rash  . Sulfa Antibiotics Rash    VITALS:  Blood pressure 94/54, pulse 56, temperature 97.6 F (36.4 C), temperature source Oral, resp. rate 16, height  (1.626 m), weight 49.896 kg (110 lb), SpO2 96 %.  PHYSICAL EXAMINATION:  GENERAL:  81 y.o.-year-old patient lying in the bed with no acute distress.  EYES: Pupils equal, round, reactive to light and accommodation. No scleral icterus. Extraocular muscles intact.  HEENT: Head atraumatic, normocephalic. Oropharynx and nasopharynx clear. Dry oral mucosa. NECK:  Supple, no jugular venous distention. No thyroid enlargement, no tenderness.  LUNGS: Normal breath sounds bilaterally, no wheezing, rales,rhonchi or crepitation. No use of accessory muscles of respiration.  CARDIOVASCULAR: S1, S2 normal. Systolic murmurs 3/6, no rubs, or gallops.  ABDOMEN: Soft, nontender, nondistended. Bowel sounds present. No organomegaly or mass.  EXTREMITIES: No pedal  edema, cyanosis, or clubbing.  NEUROLOGIC: Cranial nerves II through XII are intact. Muscle strength 4/5 in all extremities. Sensation intact. Gait not checked.  PSYCHIATRIC: The patient is alert and oriented x 3.  SKIN: No obvious rash, lesion, or ulcer. Decreased skin turgor.   LABORATORY PANEL:   CBC  Recent Labs Lab 01/11/15 0453  WBC 6.0  HGB 8.7*  HCT 27.1*  PLT 77*   ------------------------------------------------------------------------------------------------------------------  Chemistries   Recent Labs Lab 01/10/15 1947 01/11/15 0453  NA 131* 137  K 6.2* 4.3  CL 91* 98*  CO2 30 34*  GLUCOSE 104* 83  BUN 103* 98*  CREATININE 2.62* 2.29*  CALCIUM 8.6* 9.0  AST 134*  --   ALT 84*  --   ALKPHOS 119  --   BILITOT 1.5*  --    ------------------------------------------------------------------------------------------------------------------  Cardiac Enzymes No results for input(s): TROPONINI in the last 168 hours. ------------------------------------------------------------------------------------------------------------------  RADIOLOGY:  Dg Tibia/fibula Left  01/10/2015   CLINICAL DATA:  Anterior wound with bruising and swelling  EXAM: LEFT TIBIA AND FIBULA - 2 VIEW  COMPARISON:  None.  FINDINGS: Frontal and lateral views were obtained. There is a bandage over the mid tibial region medially. There is no fracture or dislocation. No erosive change or bony destruction. There is mild benign-appearing endosteal thickening along the anterior and medial mid tibia, likely reactive due to trauma in this area.  IMPRESSION: Mild benignendosteal thickening mid tibia. No erosive change or bony destruction. No abnormal periosteal reaction. No fracture or dislocation. No soft tissue air seen.   Electronically Signed   By: Bretta Bang III M.D.   On: 01/10/2015 20:29   US Renal  01/11/2015  CLINICAL DATA:  Acute renal failure.  EXAM: RENAL / URINARY TRACT ULTRASOUND  COMPLETE  COMPARISON:  None.  FINDINGS: Right Kidney:  Length: 8.7 cm. Parenchymal echogenicity is increased. No mass or hydronephrosis.  Left Kidney:  Length: 8.9 cm. Parenchymal echogenicity is increased. No mass or hydronephrosis.  Bladder:  Ureteral jets are seen bilaterally.  IMPRESSION: Increased renal parenchymal echogenicity is in keeping with chronic medical renal disease. No acute findings.   Electronically Signed   By: Leanna Battles M.D.   On: 01/11/2015 13:18    EKG:   Orders placed or performed during the hospital encounter of 01/10/15  . EKG 12-Lead  . EKG 12-Lead    ASSESSMENT AND PLAN:   Acute on chronic kidney failure. Slightly better. Continue gentle rehydration and follow-up BMP. Hold Lasix.  Hyperkalemia. Improved Hyponatremia. Improved  Wound of left lower extremity. Continue vancomycin, wound care consult and Continue Unna boot.   Elevated INR - hold warfarin, follow-up INR, warfarin per pharmacy dosing. Atrial fibrillation - anticoagulation as above, continue Lopressor. Chronic systolic congestive heart failure, stable. hold diuretics and follow-up cardiology consult.  Essential hypertension -controlled. continue Lopressor. COPD (chronic obstructive pulmonary disease) - stable. continue home inhalers  Anemia of chronic disease. Stable. Chronic thrombocytopenia. Stable  PT evaluation. The patient may need home health and PT.  All the records are reviewed and case discussed with Care Management/Social Workerr. Management plans discussed with the patient, the patient's son and they are in agreement.  CODE STATUS: Full code.  TOTAL TIME TAKING CARE OF THIS PATIENT: 43  minutes.   POSSIBLE D/C IN 4 DAYS, DEPENDING ON CLINICAL CONDITION.   Shaune Pollack M.D on 01/11/2015 at 1:33 PM  Between 7am to 6pm - Pager - [phone]  After 6pm go to [email or web address] - password [email or web address] [facility]  [place] [facility]  Office  [phone]  CC: Primary care  physician; Marisue Ivan, MD

## 2015-01-11 NOTE — Progress Notes (Signed)
ANTIBIOTIC CONSULT NOTE - INITIAL  Pharmacy Consult for vancomycin dosing Indication: Leg wound infection  Allergies  Allergen Reactions  . Latex Rash  . Levofloxacin Rash  . Sulfa Antibiotics Rash    Patient Measurements: Height: 5\' 4"  (162.6 cm) Weight: 110 lb (49.896 kg) IBW/kg (Calculated) : 54.7 Adjusted Body Weight: 49.9  Vital Signs: Temp: 97.6 F (36.4 C) (06/20 0844) BP: 94/54 mmHg (06/20 0844) Pulse Rate: 56 (06/20 0844) Intake/Output from previous day: 06/19 0701 - 06/20 0700 In: 1400 [I.V.:1400] Out: -  Intake/Output from this shift: Total I/O In: 184 [P.O.:120; I.V.:64] Out: -   Labs:  Recent Labs  01/10/15 1947 01/11/15 0453  WBC 9.5 6.0  HGB 9.3* 8.7*  PLT 100* 77*  CREATININE 2.62* 2.29*   Estimated Creatinine Clearance: 13.6 mL/min (by C-G formula based on Cr of 2.29).  Microbiology: No results found for this or any previous visit (from the past 720 hour(s)).  Medical History: Past Medical History  Diagnosis Date  . Asthma   . COPD (chronic obstructive pulmonary disease)   . A-fib   . Gout   . Essential hypertension   . Cardiomyopathy   . Chronic kidney disease   . Chronic systolic congestive heart failure   . Severe mitral regurgitation   . Mild aortic regurgitation   . Severe tricuspid regurgitation    . allopurinol  100 mg Oral Daily  . metoprolol tartrate  12.5 mg Oral BID  . mometasone-formoterol  2 puff Inhalation BID  . montelukast  10 mg Oral Daily  . sodium chloride  3 mL Intravenous Q12H  . [START ON 01/12/2015] Vancomycin  750 mg Intravenous Q48H  . [START ON 01/12/2015] warfarin  2 mg Oral Daily    Assessment: 81 yo female with infected LE hematoma. Wound cx pending  Goal of Therapy:  Vancomycin trough level 15-20 mcg/ml  6/20: Ke 0.016 t1/2 43  Vd 35   Wt=49.9kg  Plan:  Patient currently ordered Vancomycin 1 gram IV q72h. Renal fxn improved. Will transition to Vancomycin 750mg  IV Q48h. 1st dose of 1 gram  given in ER 6/19 at 2219. Will schedule 2nd dose of 750mg  ~ 41 hrs after 1st dose for stacked dosing.  Will order trough prior to 3rd dose on 6/23 at 1530.  Bari Mantis PharmD Clinical Pharmacist 01/11/2015

## 2015-01-12 LAB — CBC
HCT: 25.4 % — ABNORMAL LOW (ref 35.0–47.0)
Hemoglobin: 8.2 g/dL — ABNORMAL LOW (ref 12.0–16.0)
MCH: 31.2 pg (ref 26.0–34.0)
MCHC: 32.4 g/dL (ref 32.0–36.0)
MCV: 96.4 fL (ref 80.0–100.0)
Platelets: 70 10*3/uL — ABNORMAL LOW (ref 150–440)
RBC: 2.64 MIL/uL — AB (ref 3.80–5.20)
RDW: 18.8 % — AB (ref 11.5–14.5)
WBC: 5.4 10*3/uL (ref 3.6–11.0)

## 2015-01-12 LAB — BASIC METABOLIC PANEL
ANION GAP: 9 (ref 5–15)
BUN: 90 mg/dL — ABNORMAL HIGH (ref 6–20)
CO2: 31 mmol/L (ref 22–32)
CREATININE: 2.1 mg/dL — AB (ref 0.44–1.00)
Calcium: 8.1 mg/dL — ABNORMAL LOW (ref 8.9–10.3)
Chloride: 97 mmol/L — ABNORMAL LOW (ref 101–111)
GFR calc Af Amer: 23 mL/min — ABNORMAL LOW (ref >60)
GFR calc non Af Amer: 20 mL/min — ABNORMAL LOW (ref >60)
Glucose, Bld: 93 mg/dL (ref 65–99)
Potassium: 3.4 mmol/L — ABNORMAL LOW (ref 3.5–5.1)
Sodium: 137 mmol/L (ref 135–145)

## 2015-01-12 LAB — PROTIME-INR
INR: 3.15
Prothrombin Time: 32.4 seconds — ABNORMAL HIGH (ref 11.4–15.0)

## 2015-01-12 LAB — MAGNESIUM: Magnesium: 1.8 mg/dL (ref 1.7–2.4)

## 2015-01-12 MED ORDER — ENSURE ENLIVE PO LIQD
237.0000 mL | Freq: Every day | ORAL | Status: DC
  Administered 2015-01-12 – 2015-01-14 (×3): 237 mL via ORAL

## 2015-01-12 MED ORDER — PIPERACILLIN-TAZOBACTAM 3.375 G IVPB
3.3750 g | Freq: Two times a day (BID) | INTRAVENOUS | Status: DC
  Administered 2015-01-12 – 2015-01-13 (×3): 3.375 g via INTRAVENOUS
  Filled 2015-01-12 (×5): qty 50

## 2015-01-12 MED ORDER — METOPROLOL TARTRATE 25 MG PO TABS
12.5000 mg | ORAL_TABLET | Freq: Two times a day (BID) | ORAL | Status: DC
  Administered 2015-01-13 – 2015-01-19 (×7): 12.5 mg via ORAL
  Filled 2015-01-12 (×14): qty 1

## 2015-01-12 MED ORDER — POTASSIUM CHLORIDE CRYS ER 20 MEQ PO TBCR
40.0000 meq | EXTENDED_RELEASE_TABLET | Freq: Once | ORAL | Status: AC
  Administered 2015-01-12: 40 meq via ORAL
  Filled 2015-01-12: qty 2

## 2015-01-12 NOTE — Progress Notes (Signed)
ANTICOAGULATION CONSULT NOTE - Initial Consult  Pharmacy Consult for warfarin dosing Indication: atrial fibrillation  Allergies  Allergen Reactions  . Latex Rash  . Levofloxacin Rash  . Sulfa Antibiotics Rash    Patient Measurements: Height: 5\' 4"  (162.6 cm) Weight: 116 lb 9.6 oz (52.889 kg) IBW/kg (Calculated) : 54.7 Heparin Dosing Weight: n/a  Vital Signs: Temp: 97.4 F (36.3 C) (06/21 0016) Temp Source: Oral (06/21 0016) BP: 98/50 mmHg (06/21 0035) Pulse Rate: 69 (06/21 0503)  Labs:  Recent Labs  01/10/15 1947 01/11/15 0453 01/12/15 0502  HGB 9.3* 8.7* 8.2*  HCT 29.4* 27.1* 25.4*  PLT 100* 77* 70*  LABPROT 33.9* 32.3* 32.4*  INR 3.34 3.14 3.15  CREATININE 2.62* 2.29* 2.10*    Estimated Creatinine Clearance: 15.8 mL/min (by C-G formula based on Cr of 2.1).   Medical History: Past Medical History  Diagnosis Date  . Asthma   . COPD (chronic obstructive pulmonary disease)   . A-fib   . Gout   . Essential hypertension   . Cardiomyopathy   . Chronic kidney disease   . Chronic systolic congestive heart failure   . Severe mitral regurgitation   . Mild aortic regurgitation   . Severe tricuspid regurgitation     Home med:  Coumadin 2mg  daily.  Assessment: 6/19 INR = 3.34, no coumadin given. 6/20 INR = 3.14, no coumadin given. 6/21 INR = 3.15  Goal of Therapy:  INR 2-3   Plan:    Will not start Coumadin at this time.  Will check INR in the morning and reassess therapy.   Stormy Card, RPh   01/12/2015

## 2015-01-12 NOTE — Clinical Documentation Improvement (Signed)
Presents with ARF and wound check of left leg. CKD is documented.   White female  GFR's ranging from 15 to 20 for this admission  Please clarify the likely stage of CKD of your patient from the chart below and document findings in next progress note and include in discharge summary if applicable.  _______CKD Stage I - GFR > OR = 90 _______CKD Stage II - GFR 60-80 _______CKD Stage III - GFR 30-59 _______CKD Stage IV - GFR 15-29 _______CKD Stage V - GFR < 15 _______ESRD (End Stage Renal Disease) _______Other condition_____________ _______Cannot Clinically determine    Thank You, Shellee Milo ,RN Clinical Documentation Specialist:  [phone]  [facility]- Health Information Management

## 2015-01-12 NOTE — Progress Notes (Signed)
[facility] Physicians - [place] at [facility]   PATIENT NAME: Alicia Trujillo    MR#:  [id number]  DATE OF BIRTH:  Mar 30, 1925  SUBJECTIVE:  CHIEF COMPLAINT:   Chief Complaint  Patient presents with  . Wound Check   generalized weakness.   REVIEW OF SYSTEMS:  CONSTITUTIONAL: No fever, generalized weakness.  EYES: No blurred or double vision.  EARS, NOSE, AND THROAT: No tinnitus or ear pain. Hard hearing. RESPIRATORY: No cough, shortness of breath, wheezing or hemoptysis.  CARDIOVASCULAR: No chest pain, orthopnea, edema.  GASTROINTESTINAL: No nausea, vomiting, diarrhea or abdominal pain.  GENITOURINARY: No dysuria, hematuria.  ENDOCRINE: No polyuria, nocturia,  HEMATOLOGY: No anemia, easy bruising or bleeding SKIN: No rash or lesion.  MUSCULOSKELETAL: No joint pain or arthritis.   NEUROLOGIC: No tingling, numbness, weakness.  PSYCHIATRY: No anxiety or depression.   DRUG ALLERGIES:   Allergies  Allergen Reactions  . Latex Rash  . Levofloxacin Rash  . Sulfa Antibiotics Rash    VITALS:  Blood pressure 100/58, pulse 53, temperature 97.4 F (36.3 C), temperature source Oral, resp. rate 20, height  (1.626 m), weight 52.889 kg (116 lb 9.6 oz), SpO2 100 %.  PHYSICAL EXAMINATION:  GENERAL:  81 y.o.-year-old patient lying in the bed with no acute distress.  EYES: Pupils equal, round, reactive to light and accommodation. No scleral icterus. Extraocular muscles intact.  HEENT: Head atraumatic, normocephalic. Oropharynx and nasopharynx clear. Dry oral mucosa. NECK:  Supple, no jugular venous distention. No thyroid enlargement, no tenderness.  LUNGS: Normal breath sounds bilaterally, no wheezing, rales,rhonchi or crepitation. No use of accessory muscles of respiration.  CARDIOVASCULAR: S1, S2 normal. Systolic murmurs 3/6, no rubs, or gallops.  ABDOMEN: Soft, nontender, nondistended. Bowel sounds present. No organomegaly or mass.  EXTREMITIES: right leg in  dressing, left leg edema with bruises,  No cyanosis, or clubbing.  NEUROLOGIC: Cranial nerves II through XII are intact. Muscle strength 4/5 in all extremities. Sensation intact. Gait not checked.  PSYCHIATRIC: The patient is alert and oriented x 3.  SKIN: No obvious rash, lesion, or ulcer.   LABORATORY PANEL:   CBC  Recent Labs Lab 01/12/15 0502  WBC 5.4  HGB 8.2*  HCT 25.4*  PLT 70*   ------------------------------------------------------------------------------------------------------------------  Chemistries   Recent Labs Lab 01/10/15 1947  01/12/15 0502  NA 131*  < > 137  K 6.2*  < > 3.4*  CL 91*  < > 97*  CO2 30  < > 31  GLUCOSE 104*  < > 93  BUN 103*  < > 90*  CREATININE 2.62*  < > 2.10*  CALCIUM 8.6*  < > 8.1*  MG  --   --  1.8  AST 134*  --   --   ALT 84*  --   --   ALKPHOS 119  --   --   BILITOT 1.5*  --   --   < > = values in this interval not displayed. ------------------------------------------------------------------------------------------------------------------  Cardiac Enzymes No results for input(s): TROPONINI in the last 168 hours. ------------------------------------------------------------------------------------------------------------------  RADIOLOGY:  Dg Tibia/fibula Left  01/10/2015   CLINICAL DATA:  Anterior wound with bruising and swelling  EXAM: LEFT TIBIA AND FIBULA - 2 VIEW  COMPARISON:  None.  FINDINGS: Frontal and lateral views were obtained. There is a bandage over the mid tibial region medially. There is no fracture or dislocation. No erosive change or bony destruction. There is mild benign-appearing endosteal thickening along the anterior and medial mid tibia,  likely reactive due to trauma in this area.  IMPRESSION: Mild benignendosteal thickening mid tibia. No erosive change or bony destruction. No abnormal periosteal reaction. No fracture or dislocation. No soft tissue air seen.   Electronically Signed   By: Bretta Bang III  M.D.   On: 01/10/2015 20:29   US Renal  01/11/2015   CLINICAL DATA:  Acute renal failure.  EXAM: RENAL / URINARY TRACT ULTRASOUND COMPLETE  COMPARISON:  None.  FINDINGS: Right Kidney:  Length: 8.7 cm. Parenchymal echogenicity is increased. No mass or hydronephrosis.  Left Kidney:  Length: 8.9 cm. Parenchymal echogenicity is increased. No mass or hydronephrosis.  Bladder:  Ureteral jets are seen bilaterally.  IMPRESSION: Increased renal parenchymal echogenicity is in keeping with chronic medical renal disease. No acute findings.   Electronically Signed   By: Leanna Battles M.D.   On: 01/11/2015 13:18    EKG:   Orders placed or performed during the hospital encounter of 01/10/15  . EKG 12-Lead  . EKG 12-Lead    ASSESSMENT AND PLAN:   Acute on chronic kidney failure (currently stage 4).  Improving.  Continue gentle rehydration and follow-up BMP. Hold Lasix.  Hyperkalemia. KCl, mag is normal. Hyponatremia. Improved  Wound of left lower extremity. Continue vancomycin, wound care consult and Continue Unna boot.   Elevated INR - hold warfarin, follow-up INR, warfarin per pharmacy dosing. Atrial fibrillation - anticoagulation as above, continue Lopressor. Chronic systolic congestive heart failure, stable. hold diuretics and follow-up cardiology consult.  Essential hypertension -controlled. continue Lopressor. COPD (chronic obstructive pulmonary disease) - stable. continue home inhalers  Anemia of chronic disease. Stable. Chronic thrombocytopenia. Stable  PT evaluation. The patient may need home health and PT.  All the records are reviewed and case discussed with Care Management/Social Workerr. Management plans discussed with the patient, the patient's son and they are in agreement.  CODE STATUS: Full code.  TOTAL TIME TAKING CARE OF THIS PATIENT: 39   minutes.   POSSIBLE D/C IN 4 DAYS, DEPENDING ON CLINICAL CONDITION.   Shaune Pollack M.D on 01/12/2015 at 1:11 PM  Between 7am  to 6pm - Pager - [phone]  After 6pm go to [email or web address] - password [email or web address] [facility]  [place] [facility]  Office  [phone]  CC: Primary care physician; Marisue Ivan, MD

## 2015-01-12 NOTE — Consult Note (Signed)
Reason for Consult: shortness of breath atrial fibrillation edema Referring Physician: Dr Jarvis Morgan is an 81 y.o. female.  HPI:  81 year old female history of chronic atrial fibrillation on anticoagulation difficult-to-control developed a injury to her left leg including hematoma with bleeding. Patient complains of known history of congestive heart failure cardiomyopathy with systolic dysfunction. Patient is had shortness of breath injured her left leg develop for hematoma with bleeding and was seen in emergency room. Patient has had supratherapeutic Coumadin levels and anticoagulation she has been seen Dr. Ubaldo Glassing for  Shortness of breath regularly over the last several months per her anticoagulation control has been very difficult nonetheless she has chronic renal insufficiency now presents for further evaluation shortness of breath is soda moderate palpitations also moderate the wound is now being treated and bandage and the patient feels reasonably well denies any pain  Past Medical History  Diagnosis Date  . Asthma   . COPD (chronic obstructive pulmonary disease)   . A-fib   . Gout   . Essential hypertension   . Cardiomyopathy   . Chronic kidney disease   . Chronic systolic congestive heart failure   . Severe mitral regurgitation   . Mild aortic regurgitation   . Severe tricuspid regurgitation     Past Surgical History  Procedure Laterality Date  . Abdominal hysterectomy    . Bladder surgery      Family History  Problem Relation Age of Onset  . Asthma Father   . Lung cancer Brother     Social History:  reports that she has quit smoking. She has never used smokeless tobacco. She reports that she does not drink alcohol or use illicit drugs.  Allergies:  Allergies  Allergen Reactions  . Latex Rash  . Levofloxacin Rash  . Sulfa Antibiotics Rash    Medications:  Prior to Admission:  Prescriptions prior to admission  Medication Sig Dispense Refill Last Dose  .  albuterol (PROAIR HFA) 108 (90 BASE) MCG/ACT inhaler Inhale 2 puffs into the lungs every 4 (four) hours as needed.   unknown  . allopurinol (ZYLOPRIM) 100 MG tablet Take 100 mg by mouth daily.  0 unknown  . calcium-vitamin D (CALCIUM 500/D) 500-200 MG-UNIT per tablet Take 1 tablet by mouth daily.   unknown  . fexofenadine (ALLEGRA) 180 MG tablet Take 180 mg by mouth daily.   unknown  . Fluticasone-Salmeterol (ADVAIR DISKUS) 250-50 MCG/DOSE AEPB Inhale 1 puff into the lungs every 12 (twelve) hours.   unknown  . furosemide (LASIX) 40 MG tablet Take 40 mg by mouth 2 (two) times daily as needed.   unknown  . metolazone (ZAROXOLYN) 2.5 MG tablet Take 2.5 mg by mouth daily. Stop if BP drops below 90/60.   unknown  . metoprolol tartrate (LOPRESSOR) 25 MG tablet Take 12.5 mg by mouth 2 (two) times daily.   unknown  . montelukast (SINGULAIR) 10 MG tablet Take 10 mg by mouth daily.   unknown  . Multiple Vitamin (MULTI-VITAMINS) TABS Take 1 tablet by mouth daily.   unknown  . potassium chloride (K-DUR) 10 MEQ tablet Take 2 tablets (20 mEq total) by mouth 2 (two) times daily. 6 tablet 0   . spironolactone (ALDACTONE) 25 MG tablet Take 25 mg by mouth daily.   unknown  . warfarin (COUMADIN) 2 MG tablet Take 2 mg by mouth daily.   unknown    Results for orders placed or performed during the hospital encounter of 01/10/15 (from the past 48 hour(s))  CBC with Differential/Platelet     Status: Abnormal   Collection Time: 01/10/15  7:47 PM  Result Value Ref Range   WBC 9.5 3.6 - 11.0 K/uL   RBC 3.02 (L) 3.80 - 5.20 MIL/uL   Hemoglobin 9.3 (L) 12.0 - 16.0 g/dL   HCT 29.4 (L) 35.0 - 47.0 %   MCV 97.5 80.0 - 100.0 fL   MCH 30.8 26.0 - 34.0 pg   MCHC 31.6 (L) 32.0 - 36.0 g/dL   RDW 18.6 (H) 11.5 - 14.5 %   Platelets 100 (L) 150 - 440 K/uL   Neutrophils Relative % 81 %   Neutro Abs 7.6 (H) 1.4 - 6.5 K/uL   Lymphocytes Relative 10 %   Lymphs Abs 1.0 1.0 - 3.6 K/uL   Monocytes Relative 9 %   Monocytes  Absolute 0.8 0.2 - 0.9 K/uL   Eosinophils Relative 0 %   Eosinophils Absolute 0.0 0 - 0.7 K/uL   Basophils Relative 0 %   Basophils Absolute 0.0 0 - 0.1 K/uL  Comprehensive metabolic panel     Status: Abnormal   Collection Time: 01/10/15  7:47 PM  Result Value Ref Range   Sodium 131 (L) 135 - 145 mmol/L   Potassium 6.2 (H) 3.5 - 5.1 mmol/L   Chloride 91 (L) 101 - 111 mmol/L   CO2 30 22 - 32 mmol/L   Glucose, Bld 104 (H) 65 - 99 mg/dL   BUN 103 (H) 6 - 20 mg/dL    Comment: RESULTS CONFIRMED BY MANUAL DILUTION   Creatinine, Ser 2.62 (H) 0.44 - 1.00 mg/dL   Calcium 8.6 (L) 8.9 - 10.3 mg/dL   Total Protein 6.8 6.5 - 8.1 g/dL   Albumin 3.6 3.5 - 5.0 g/dL   AST 134 (H) 15 - 41 U/L   ALT 84 (H) 14 - 54 U/L   Alkaline Phosphatase 119 38 - 126 U/L   Total Bilirubin 1.5 (H) 0.3 - 1.2 mg/dL   GFR calc non Af Amer 15 (L) >60 mL/min   GFR calc Af Amer 18 (L) >60 mL/min    Comment: (NOTE) The eGFR has been calculated using the CKD EPI equation. This calculation has not been validated in all clinical situations. eGFR's persistently <60 mL/min signify possible Chronic Kidney Disease.    Anion gap 10 5 - 15  Protime-INR     Status: Abnormal   Collection Time: 01/10/15  7:47 PM  Result Value Ref Range   Prothrombin Time 33.9 (H) 11.4 - 15.0 seconds   INR 3.34   Wound culture     Status: None (Preliminary result)   Collection Time: 01/10/15  8:36 PM  Result Value Ref Range   Specimen Description LEG LEFT LEG    Special Requests Normal    Gram Stain      RARE WBC SEEN RARE GRAM POSITIVE COCCI RARE GRAM NEGATIVE RODS    Culture      MODERATE GROWTH GRAM NEGATIVE RODS LIGHT GROWTH GRAM NEGATIVE RODS IDENTIFICATION AND SUSCEPTIBILITIES TO FOLLOW ONCE ISOLATED    Report Status PENDING   Protime-INR     Status: Abnormal   Collection Time: 01/11/15  4:53 AM  Result Value Ref Range   Prothrombin Time 32.3 (H) 11.4 - 15.0 seconds   INR 3.14   CBC     Status: Abnormal   Collection Time:  01/11/15  4:53 AM  Result Value Ref Range   WBC 6.0 3.6 - 11.0 K/uL   RBC 2.77 (L) 3.80 -  5.20 MIL/uL   Hemoglobin 8.7 (L) 12.0 - 16.0 g/dL   HCT 27.1 (L) 35.0 - 47.0 %   MCV 97.7 80.0 - 100.0 fL   MCH 31.4 26.0 - 34.0 pg   MCHC 32.1 32.0 - 36.0 g/dL   RDW 18.7 (H) 11.5 - 14.5 %   Platelets 77 (L) 150 - 440 K/uL  Basic metabolic panel     Status: Abnormal   Collection Time: 01/11/15  4:53 AM  Result Value Ref Range   Sodium 137 135 - 145 mmol/L   Potassium 4.3 3.5 - 5.1 mmol/L   Chloride 98 (L) 101 - 111 mmol/L   CO2 34 (H) 22 - 32 mmol/L   Glucose, Bld 83 65 - 99 mg/dL   BUN 98 (H) 6 - 20 mg/dL   Creatinine, Ser 2.29 (H) 0.44 - 1.00 mg/dL   Calcium 9.0 8.9 - 10.3 mg/dL   GFR calc non Af Amer 18 (L) >60 mL/min   GFR calc Af Amer 21 (L) >60 mL/min    Comment: (NOTE) The eGFR has been calculated using the CKD EPI equation. This calculation has not been validated in all clinical situations. eGFR's persistently <60 mL/min signify possible Chronic Kidney Disease.    Anion gap 5 5 - 15  Protime-INR     Status: Abnormal   Collection Time: 01/12/15  5:02 AM  Result Value Ref Range   Prothrombin Time 32.4 (H) 11.4 - 15.0 seconds   INR 3.15   CBC     Status: Abnormal   Collection Time: 01/12/15  5:02 AM  Result Value Ref Range   WBC 5.4 3.6 - 11.0 K/uL   RBC 2.64 (L) 3.80 - 5.20 MIL/uL   Hemoglobin 8.2 (L) 12.0 - 16.0 g/dL   HCT 25.4 (L) 35.0 - 47.0 %   MCV 96.4 80.0 - 100.0 fL   MCH 31.2 26.0 - 34.0 pg   MCHC 32.4 32.0 - 36.0 g/dL   RDW 18.8 (H) 11.5 - 14.5 %   Platelets 70 (L) 150 - 440 K/uL  Basic metabolic panel     Status: Abnormal   Collection Time: 01/12/15  5:02 AM  Result Value Ref Range   Sodium 137 135 - 145 mmol/L   Potassium 3.4 (L) 3.5 - 5.1 mmol/L   Chloride 97 (L) 101 - 111 mmol/L   CO2 31 22 - 32 mmol/L   Glucose, Bld 93 65 - 99 mg/dL   BUN 90 (H) 6 - 20 mg/dL   Creatinine, Ser 2.10 (H) 0.44 - 1.00 mg/dL   Calcium 8.1 (L) 8.9 - 10.3 mg/dL   GFR calc  non Af Amer 20 (L) >60 mL/min   GFR calc Af Amer 23 (L) >60 mL/min    Comment: (NOTE) The eGFR has been calculated using the CKD EPI equation. This calculation has not been validated in all clinical situations. eGFR's persistently <60 mL/min signify possible Chronic Kidney Disease.    Anion gap 9 5 - 15    Dg Tibia/fibula Left  01/10/2015   CLINICAL DATA:  Anterior wound with bruising and swelling  EXAM: LEFT TIBIA AND FIBULA - 2 VIEW  COMPARISON:  None.  FINDINGS: Frontal and lateral views were obtained. There is a bandage over the mid tibial region medially. There is no fracture or dislocation. No erosive change or bony destruction. There is mild benign-appearing endosteal thickening along the anterior and medial mid tibia, likely reactive due to trauma in this area.  IMPRESSION: Mild benignendosteal thickening  mid tibia. No erosive change or bony destruction. No abnormal periosteal reaction. No fracture or dislocation. No soft tissue air seen.   Electronically Signed   By: Lowella Grip III M.D.   On: 01/10/2015 20:29   US Renal  01/11/2015   CLINICAL DATA:  Acute renal failure.  EXAM: RENAL / URINARY TRACT ULTRASOUND COMPLETE  COMPARISON:  None.  FINDINGS: Right Kidney:  Length: 8.7 cm. Parenchymal echogenicity is increased. No mass or hydronephrosis.  Left Kidney:  Length: 8.9 cm. Parenchymal echogenicity is increased. No mass or hydronephrosis.  Bladder:  Ureteral jets are seen bilaterally.  IMPRESSION: Increased renal parenchymal echogenicity is in keeping with chronic medical renal disease. No acute findings.   Electronically Signed   By: Lorin Picket M.D.   On: 01/11/2015 13:18    Review of Systems  Constitutional: Positive for malaise/fatigue.  HENT: Positive for hearing loss.   Eyes: Negative.   Respiratory: Positive for cough and shortness of breath.   Cardiovascular: Positive for palpitations, leg swelling and PND.  Gastrointestinal: Negative.   Genitourinary: Negative.    Musculoskeletal: Positive for myalgias and falls.  Skin:       Hematoma of the left lower leg with bruising ecchymosis.  Is not bandage  Neurological: Positive for weakness.  Psychiatric/Behavioral: Negative.    Blood pressure 102/62, pulse 53, temperature 97.4 F (36.3 C), temperature source Oral, resp. rate 20, height 5' 4"  (1.626 m), weight 52.889 kg (116 lb 9.6 oz), SpO2 100 %. Physical Exam  Constitutional: She is oriented to person, place, and time. She appears well-developed and well-nourished.  HENT:  Head: Normocephalic.  Right Ear: External ear normal.  Eyes: Conjunctivae are normal. Pupils are equal, round, and reactive to light.  Neck: Normal range of motion. Neck supple.  Cardiovascular: S1 normal, S2 normal and normal pulses.  An irregularly irregular rhythm present. Bradycardia present.  Exam reveals gallop and S3.   Murmur heard.  Systolic murmur is present with a grade of 2/6  Respiratory: Effort normal and breath sounds normal.  GI: Soft. Bowel sounds are normal.  Musculoskeletal: Normal range of motion.  Neurological: She is alert and oriented to person, place, and time.  Skin: Skin is warm. Abrasion, bruising, ecchymosis, lesion and rash noted. There is erythema.  Psychiatric: She has a normal mood and affect. Her behavior is normal.    Assessment/Plan:  edema  hematoma of the left leg  coagulopathy  congestive heart failure  acute renal failure/ chronic renal failure  hypertension  cardiomyopathy  leg injury  atrial fibrillation  mitral regurgitation . PLAN  Correct coagulopathy to maintain an INR of about 2-2.5 Continue wound care therapy will rapid legs  continue heart failure treatment  inhalers as necessary  metoprolol for rate control  continue blood pressure control  consider nephrology involvement for renal insufficiency  consider support stockings for chronic edema  medical therapy for congestive heart failure  pain control with narcotics  as necessary  do not recommend cardiac catheterization for invasive procedure at this stage  CALLWOOD,DWAYNE D. 01/12/2015, 8:30 AM

## 2015-01-12 NOTE — Progress Notes (Signed)
ANTIBIOTIC CONSULT NOTE - INITIAL  Pharmacy Consult for Zosyn Indication: wound infection   Allergies  Allergen Reactions  . Latex Rash  . Levofloxacin Rash  . Sulfa Antibiotics Rash    Patient Measurements: Height: 5\' 4"  (162.6 cm) Weight: 116 lb 9.6 oz (52.889 kg) IBW/kg (Calculated) : 54.7  Vital Signs: Temp: 97.4 F (36.3 C) (06/21 0805) Temp Source: Oral (06/21 0805) BP: 100/58 mmHg (06/21 1000) Pulse Rate: 120 (06/21 1316)  Labs:  Recent Labs  01/10/15 1947 01/11/15 0453 01/12/15 0502  WBC 9.5 6.0 5.4  HGB 9.3* 8.7* 8.2*  PLT 100* 77* 70*  CREATININE 2.62* 2.29* 2.10*   Estimated Creatinine Clearance: 15.8 mL/min (by C-G formula based on Cr of 2.1).  Microbiology: Recent Results (from the past 720 hour(s))  Wound culture     Status: None (Preliminary result)   Collection Time: 01/10/15  8:36 PM  Result Value Ref Range Status   Specimen Description LEG LEFT LEG  Final   Special Requests Normal  Final   Gram Stain   Final    RARE WBC SEEN RARE GRAM POSITIVE COCCI RARE GRAM NEGATIVE RODS    Culture   Final    MODERATE GROWTH GRAM NEGATIVE RODS LIGHT GROWTH GRAM NEGATIVE RODS LIGHT GROWTH STAPHYLOCOCCUS AUREUS IDENTIFICATION AND SUSCEPTIBILITIES TO FOLLOW    Report Status PENDING  Incomplete    Medications:  Anti-infectives    Start     Dose/Rate Route Frequency Ordered Stop   01/12/15 2100  vancomycin (VANCOCIN) IVPB 1000 mg/200 mL premix  Status:  Discontinued     1,000 mg 200 mL/hr over 60 Minutes Intravenous every 72 hours 01/11/15 0200 01/11/15 1145   01/12/15 1600  vancomycin (VANCOCIN) IVPB 750 mg/150 ml premix     750 mg 150 mL/hr over 60 Minutes Intravenous Every 48 hours 01/11/15 1145     01/12/15 1430  piperacillin-tazobactam (ZOSYN) IVPB 3.375 g     3.375 g 12.5 mL/hr over 240 Minutes Intravenous Every 12 hours 01/12/15 1359     01/11/15 2200  vancomycin (VANCOCIN) IVPB 1000 mg/200 mL premix  Status:  Discontinued     1,000 mg 200  mL/hr over 60 Minutes Intravenous  Once 01/11/15 0120 01/11/15 0200   01/10/15 2215  vancomycin (VANCOCIN) IVPB 1000 mg/200 mL premix     1,000 mg 200 mL/hr over 60 Minutes Intravenous  Once 01/10/15 2202 01/10/15 2319     Assessment: Patient on vancomycin for wound infection, now with GNR on wound culture, adding zosyn  Plan:  Ordered zosyn 3.375gm IV Q12H extended infusion, due to CrCl < 77ml/min. Will continue to assess renal function and follow wound cultures.  Pharmacy to follow per consult   Garlon Hatchet, PharmD Clinical Pharmacist 01/12/2015,2:03 PM

## 2015-01-12 NOTE — Progress Notes (Addendum)
Subjective:   resting quietly asleep in bed no evidence of pain left leg was wrapped and bandaged no evidence of bleeding or swelling good pulses  Objective:  Vital Signs in the last 24 hours: Temp:  [97.4 F (36.3 C)-98.4 F (36.9 C)] 98.4 F (36.9 C) (06/21 1538) Pulse Rate:  [53-125] 75 (06/21 1538) Resp:  [14-20] 20 (06/21 0805) BP: (87-102)/(42-71) 100/60 mmHg (06/21 1545) SpO2:  [80 %-100 %] 85 % (06/21 1538) Weight:  [52.889 kg (116 lb 9.6 oz)] 52.889 kg (116 lb 9.6 oz) (06/21 0531)  Intake/Output from previous day: 06/20 0701 - 06/21 0700 In: 1360.3 [P.O.:596; I.V.:764.3] Out: 800 [Urine:800] Intake/Output from this shift: Total I/O In: 1170.4 [P.O.:600; I.V.:570.4] Out: -   Physical Exam: General appearance: alert, cooperative and appears stated age Neck: no adenopathy, no carotid bruit, no JVD, supple, symmetrical, trachea midline and thyroid not enlarged, symmetric, no tenderness/mass/nodules Lungs: clear to auscultation bilaterally Heart: regular rate and rhythm, S1, S2 normal, no murmur, click, rub or gallop Abdomen: soft, non-tender; bowel sounds normal; no masses,  no organomegaly Extremities: edema Left leg bandage with ecchymoses bruising hematoma no bleeding Pulses: 2+ and symmetric Skin: Skin color, texture, turgor normal. No rashes or lesions or ecchymoses - lower leg(s) left Neurologic: Grossly normal  Lab Results:  Recent Labs  01/11/15 0453 01/12/15 0502  WBC 6.0 5.4  HGB 8.7* 8.2*  PLT 77* 70*    Recent Labs  01/11/15 0453 01/12/15 0502  NA 137 137  K 4.3 3.4*  CL 98* 97*  CO2 34* 31  GLUCOSE 83 93  BUN 98* 90*  CREATININE 2.29* 2.10*   No results for input(s): TROPONINI in the last 72 hours.  Invalid input(s): CK, MB Hepatic Function Panel  Recent Labs  01/10/15 1947  PROT 6.8  ALBUMIN 3.6  AST 134*  ALT 84*  ALKPHOS 119  BILITOT 1.5*   No results for input(s): CHOL in the last 72 hours. No results for input(s):  PROTIME in the last 72 hours.  Imaging: Imaging results have been reviewed  Cardiac Studies:  Assessment/Plan:  Arrhythmia Atrial Fibrillation CHF Edema Palpitations Shortness of Breath Vascular Disease   hematoma left leg with bleeding  coagulopathy  hypertension  chronic renal insufficiency . PLAN  continue rapid leg and prepared keep elevated  continue to correct coagulopathy  hypertension control  continue heart failure treatment  rate control for atrial fibrillation  follow-up renal function for renal insufficiency  conservative therapy for now has continue to treat left leg wound  LOS: 2 days    Mckaela Howley D. 01/12/2015, 4:47 PM

## 2015-01-12 NOTE — Progress Notes (Signed)
Physical Therapy Treatment Patient Details Name: Alicia Trujillo MRN: [id number] DOB: 01-31-1925 Today's Date: 01/12/2015    History of Present Illness presented to ER from home secondary to L LE injury/drainage with foul odor; admitted with L LE infection/wound and acute kidney failure.    PT Comments    Pt agreeable to PT. Post exercises, pt required change of brief and gown. Nursing staff in to change pt/gown and for personal hygiene. Pt then able to continue PT with bed mobility, transfers and ambulation. Pt received up in comfortably.   Follow Up Recommendations  Home health PT     Equipment Recommendations       Recommendations for Other Services       Precautions / Restrictions Restrictions Weight Bearing Restrictions: No    Mobility  Bed Mobility Overal bed mobility: Modified Independent (Increased time/effort) Bed Mobility: Supine to Sit;Rolling Rolling: Modified independent (Device/Increase time) (uses rails)   Supine to sit: Supervision        Transfers Overall transfer level: Needs assistance (Needs cueing for hands; difficult to cue due to [facility]) Equipment used: Rolling walker (2 wheeled) Transfers: Sit to/from Stand Sit to Stand: Min guard         General transfer comment: Sits before back of legs to chair and late hand placement back to chair.  Ambulation/Gait Ambulation/Gait assistance: Min guard Ambulation Distance (Feet): 25 Feet Assistive device: Rolling walker (2 wheeled) Gait Pattern/deviations: Step-to pattern;Decreased step length - right;Decreased step length - left;Decreased dorsiflexion - right;Decreased dorsiflexion - left;Trunk flexed Gait velocity: Slow Gait velocity interpretation: <1.8 ft/sec, indicative of risk for recurrent falls     Stairs            Wheelchair Mobility    Modified Rankin (Stroke Patients Only)       Balance                                    Cognition Arousal/Alertness:  Awake/alert Behavior During Therapy: WFL for tasks assessed/performed Overall Cognitive Status: Within Functional Limits for tasks assessed                      Exercises General Exercises - Lower Extremity Ankle Circles/Pumps: AROM;Both;20 reps;Supine Quad Sets: Strengthening;Both;20 reps;Supine Gluteal Sets: Strengthening;Both;20 reps;Supine Short Arc Quad: AROM;Both;20 reps;Supine Long Arc Quad: AROM;Both;10 reps;Seated Heel Slides: AAROM;Both;20 reps;Supine Hip ABduction/ADduction: AAROM;Both;20 reps;Supine Hip Flexion/Marching: AROM;Both;20 reps;Seated    General Comments        Pertinent Vitals/Pain Pain Assessment: No/denies pain    Home Living                      Prior Function            PT Goals (current goals can now be found in the care plan section) Progress towards PT goals: Progressing toward goals    Frequency  Min 2X/week    PT Plan Current plan remains appropriate    Co-evaluation             End of Session Equipment Utilized During Treatment: Gait belt Activity Tolerance: Patient tolerated treatment well Patient left: in chair;with call bell/phone within reach;with chair alarm set;with nursing/sitter in room     Time: 1316-1404 PT Time Calculation (min) (ACUTE ONLY): 48 min  Charges:  $Gait Training: 8-22 mins $Therapeutic Exercise: 8-22 mins  G Codes:      [name] 01/12/2015, 2:19 PM

## 2015-01-12 NOTE — Progress Notes (Addendum)
Initial Nutrition Assessment  INTERVENTION: Meals and Snacks: Cater to patient preferences; Recommend liberalizing order to renal diet only as pt is without h/o DM, without elevated blood glucose nor is on any insulin. Medical Food Supplement Therapy: will recommend Ensure daily and Magic cup BID Ensure Enlive (each supplement provides 350kcal and 20 grams of protein)  NUTRITION DIAGNOSIS:  Inadequate oral intake related to  (decreased appetite) as evidenced by per patient/family report.  GOAL:  Patient will meet greater than or equal to 90% of their needs  MONITOR:   (Energy Intake, Electrolyte and renal Profile, Anthropometrics)  REASON FOR ASSESSMENT:   (RD Screen, Diet Order, Diagnosis)    ASSESSMENT:  Pt admitted with acute on chronic renal failure with LLE wound infection with bleeding hematoma. PMHx:  Past Medical History  Diagnosis Date  . Asthma   . COPD (chronic obstructive pulmonary disease)   . A-fib   . Gout   . Essential hypertension   . Cardiomyopathy   . Chronic kidney disease   . Chronic systolic congestive heart failure   . Severe mitral regurgitation   . Mild aortic regurgitation   . Severe tricuspid regurgitation    Diet Order: Renal Diet, Thin Liquids  Current Nutrition: Pt reports eating 50% of breakfast this am, including eggs and potatoes. Per I and O chart pt ate 90-100% of meals yesterday.   Food/Nutrition-Related History: Pt's family member reports pt usually eating 3 meals a day however they are usually small portions and pt has not been drinking any supplements but pt daughter had recently bought some to have on hand. Pt family reports pt eats a lot of ice cream usually.   Medications: NS at 44mL/hr, KCl  Electrolyte/Renal Profile and Glucose Profile:   Recent Labs Lab 01/10/15 1947 01/11/15 0453 01/12/15 0502  NA 131* 137 137  K 6.2* 4.3 3.4*  CL 91* 98* 97*  CO2 30 34* 31  BUN 103* 98* 90*  CREATININE 2.62* 2.29* 2.10*   CALCIUM 8.6* 9.0 8.1*  GLUCOSE 104* 83 93   Protein Profile:  Recent Labs Lab 01/10/15 1947  ALBUMIN 3.6    Gastrointestinal Profile: Last BM: 6/20  Intake/Output Summary (Last 24 hours) at 01/12/15 1227 Last data filed at 01/12/15 0841  Gross per 24 hour  Intake 1738.8 ml  Output    450 ml  Net 1288.8 ml    Nutrition-Focused Physical Exam Findings: Nutrition-Focused physical exam completed. Findings are mild fat depletion of tricep however WDL of orbital and rib fat layers, mild/moderate muscle depletion of temple, clavicle and shoulder region with some severity in scauplar region of muscle, and no edema. RD could not assess legs as pt with bandages and bruising on BLE.  Weight Change: Pt family member reports UBW of 112lbs and reports pt was up to 128lbs at one time secondary to fluid. Anthropometrics: Height:  Ht Readings from Last 1 Encounters:  01/10/15  (1.626 m)    Weight:  Wt Readings from Last 1 Encounters:  01/12/15 116 lb 9.6 oz (52.889 kg)    Wt Readings from Last 10 Encounters:  01/12/15 116 lb 9.6 oz (52.889 kg)  01/03/15 113 lb 8 oz (51.483 kg)    BMI:  Body mass index is 20 kg/(m^2).  Estimated Nutritional Needs:  Kcal:  1250-1477kcals, BEE: 947kcals, TEE: (IF 1.1-1.3)(AF 1.2)   Protein:  58-68g protein (1.0-1.2g/kg)  Fluid:  1318-1520mL of fluid (25-69mL/kg)  Skin:   WDL, except left leg skin abrasion  Diet Order:  Diet renal with fluid restriction Fluid restriction:: 1200 mL Fluid; Room service appropriate?: Yes; Fluid consistency:: Thin  EDUCATION NEEDS:  No education needs identified at this time  MODERATE Care Level  Leda Quail, RD, LDN Pager [phone]

## 2015-01-13 ENCOUNTER — Inpatient Hospital Stay: Payer: Medicare Other

## 2015-01-13 LAB — CBC WITH DIFFERENTIAL/PLATELET
BASOS PCT: 0 % (ref 0–1)
Band Neutrophils: 0 % (ref 0–10)
Basophils Absolute: 0 10*3/uL (ref 0.0–0.1)
Blasts: 0 %
EOS PCT: 2 % (ref 0–5)
Eosinophils Absolute: 0.1 10*3/uL (ref 0.0–0.7)
HEMATOCRIT: 25.6 % — AB (ref 35.0–47.0)
HEMOGLOBIN: 8.1 g/dL — AB (ref 12.0–16.0)
Lymphocytes Relative: 12 % (ref 12–46)
Lymphs Abs: 0.5 10*3/uL — ABNORMAL LOW (ref 0.7–4.0)
MCH: 30.6 pg (ref 26.0–34.0)
MCHC: 31.6 g/dL — ABNORMAL LOW (ref 32.0–36.0)
MCV: 96.9 fL (ref 80.0–100.0)
MONOS PCT: 11 % (ref 3–12)
Metamyelocytes Relative: 0 %
Monocytes Absolute: 0.5 10*3/uL (ref 0.1–1.0)
Myelocytes: 0 %
NEUTROS ABS: 3.2 10*3/uL (ref 1.7–7.7)
NEUTROS PCT: 75 % (ref 43–77)
NRBC: 3 /100{WBCs} — AB
OTHER: 0 %
PLATELETS: 62 10*3/uL — AB (ref 150–440)
Promyelocytes Absolute: 0 %
RBC: 2.64 MIL/uL — AB (ref 3.80–5.20)
RDW: 18.8 % — ABNORMAL HIGH (ref 11.5–14.5)
WBC: 4.3 10*3/uL (ref 3.6–11.0)

## 2015-01-13 LAB — BASIC METABOLIC PANEL
Anion gap: 8 (ref 5–15)
BUN: 80 mg/dL — ABNORMAL HIGH (ref 6–20)
CALCIUM: 7.9 mg/dL — AB (ref 8.9–10.3)
CO2: 33 mmol/L — AB (ref 22–32)
CREATININE: 1.95 mg/dL — AB (ref 0.44–1.00)
Chloride: 98 mmol/L — ABNORMAL LOW (ref 101–111)
GFR calc Af Amer: 25 mL/min — ABNORMAL LOW (ref >60)
GFR calc non Af Amer: 22 mL/min — ABNORMAL LOW (ref >60)
GLUCOSE: 106 mg/dL — AB (ref 65–99)
Potassium: 4.3 mmol/L (ref 3.5–5.1)
Sodium: 139 mmol/L (ref 135–145)

## 2015-01-13 LAB — PROTIME-INR
INR: 3.06
Prothrombin Time: 31.7 seconds — ABNORMAL HIGH (ref 11.4–15.0)

## 2015-01-13 MED ORDER — CEFTRIAXONE SODIUM IN DEXTROSE 20 MG/ML IV SOLN
1.0000 g | INTRAVENOUS | Status: DC
  Administered 2015-01-13 – 2015-01-14 (×2): 1 g via INTRAVENOUS
  Filled 2015-01-13 (×3): qty 50

## 2015-01-13 NOTE — Progress Notes (Signed)
Physical Therapy Treatment Patient Details Name: Alicia Trujillo MRN: [id number] DOB: [birth year] Today's Date: 01/13/2015    History of Present Illness presented to ER from home secondary to L LE injury/drainage with foul odor; admitted with L LE infection/wound and acute kidney failure.    PT Comments    Spoke with nursing regarding pt's O2 saturation levels off O2 during ambulation/toileting. O2 levels on room air 96%. Pt placed back on 3L O2 at session's end.   Follow Up Recommendations  Home health PT     Equipment Recommendations       Recommendations for Other Services       Precautions / Restrictions Restrictions Weight Bearing Restrictions: No    Mobility  Bed Mobility               General bed mobility comments: Not tested; pt up in chair  Transfers Overall transfer level: Needs assistance Equipment used: Rolling walker (2 wheeled) Transfers: Sit to/from Stand Sit to Stand: Min guard (Min A from toilet; cues for safe hand placement)            Ambulation/Gait Ambulation/Gait assistance: Min guard Ambulation Distance (Feet): 30 Feet (x2) Assistive device: Rolling walker (2 wheeled) Gait Pattern/deviations: Step-through pattern;Decreased stride length Gait velocity: Slow Gait velocity interpretation: <1.8 ft/sec, indicative of risk for recurrent falls General Gait Details: Ambulates without O2; O2 saturation on room air with activity 96%; nsg notified   Stairs            Wheelchair Mobility    Modified Rankin (Stroke Patients Only)       Balance                                    Cognition Arousal/Alertness: Awake/alert Behavior During Therapy: WFL for tasks assessed/performed Overall Cognitive Status: Within Functional Limits for tasks assessed                      Exercises General Exercises - Lower Extremity Ankle Circles/Pumps: AROM;Both;20 reps;Seated Quad Sets: Strengthening;Both;20  reps;Seated Gluteal Sets: Strengthening;Both;20 reps;Seated Short Arc Quad: AROM;Both;20 reps;Seated Long Arc Quad: AROM;Both;Seated;15 reps Heel Slides: AAROM;Both;20 reps;Seated (long sit) Hip ABduction/ADduction: AAROM;Both;20 reps;Seated (long sit) Straight Leg Raises: AAROM;Both;15 reps;Seated Hip Flexion/Marching: AROM;Both;20 reps;Seated    General Comments        Pertinent Vitals/Pain Pain Assessment: No/denies pain    Home Living                      Prior Function            PT Goals (current goals can now be found in the care plan section) Progress towards PT goals: Progressing toward goals (Ambulation distance progressin; O2 levels progressing)    Frequency  Min 2X/week    PT Plan Current plan remains appropriate    Co-evaluation             End of Session Equipment Utilized During Treatment: Gait belt Activity Tolerance: Patient tolerated treatment well Patient left: in chair;with chair alarm set     Time: 5093-2671 PT Time Calculation (min) (ACUTE ONLY): 25 min  Charges:  $Gait Training: 8-22 mins $Therapeutic Exercise: 8-22 mins                    G Codes:      Alicia Trujillo 01/13/2015, 11:19 AM

## 2015-01-13 NOTE — Progress Notes (Signed)
ANTICOAGULATION CONSULT NOTE   Pharmacy Consult for warfarin dosing Indication: atrial fibrillation  Allergies  Allergen Reactions  . Latex Rash  . Levofloxacin Rash  . Sulfa Antibiotics Rash    Patient Measurements: Height: 5\' 4"  (162.6 cm) Weight: 119 lb 12.8 oz (54.341 kg) IBW/kg (Calculated) : 54.7 Heparin Dosing Weight: n/a  Vital Signs: Temp: 97.5 F (36.4 C) (06/21 2354) Temp Source: Oral (06/21 2354) BP: 91/53 mmHg (06/21 2354) Pulse Rate: 88 (06/21 2354)  Labs:  Recent Labs  01/10/15 1947 01/11/15 0453 01/12/15 0502 01/13/15 0445  HGB 9.3* 8.7* 8.2*  --   HCT 29.4* 27.1* 25.4*  --   PLT 100* 77* 70*  --   LABPROT 33.9* 32.3* 32.4* 31.7*  INR 3.34 3.14 3.15 3.06  CREATININE 2.62* 2.29* 2.10* 1.95*    Estimated Creatinine Clearance: 17.4 mL/min (by C-G formula based on Cr of 1.95).   Medical History: Past Medical History  Diagnosis Date  . Asthma   . COPD (chronic obstructive pulmonary disease)   . A-fib   . Gout   . Essential hypertension   . Cardiomyopathy   . Chronic kidney disease   . Chronic systolic congestive heart failure   . Severe mitral regurgitation   . Mild aortic regurgitation   . Severe tricuspid regurgitation     Home med:  Coumadin 2mg  daily.  Assessment: 6/19 INR = 3.34, no coumadin given. 6/20 INR = 3.14, no coumadin given. 6/21 INR = 3.15, no coumadin given. 6/22 INR = 3.05  Goal of Therapy:  INR 2-3   Plan:    Will not start Coumadin at this time.  Will check INR in the morning and reassess therapy.   Stormy Card, [place]   Clinical Pharmacist 01/13/2015

## 2015-01-13 NOTE — Progress Notes (Signed)
Dr. Imogene Burn Notified of non-sustained / sustained V-tach, and A-fib. Monitor patient at this time.

## 2015-01-13 NOTE — Progress Notes (Addendum)
[facility] - [place] at [facility]   PATIENT NAME: Alicia Trujillo    MR#:  [id number]  DATE OF BIRTH:  [birth year]  SUBJECTIVE:  CHIEF COMPLAINT:   Chief Complaint  Patient presents with  . Wound Check   no complaint. On oxygen 3 L by nasal cannular.  REVIEW OF SYSTEMS:  CONSTITUTIONAL: No fever, generalized weakness.  EYES: No blurred or double vision.  EARS, NOSE, AND THROAT: No tinnitus or ear pain. Hard hearing. RESPIRATORY: has cough, but no shortness of breath, wheezing or hemoptysis.  CARDIOVASCULAR: No chest pain, orthopnea, but has leg edema.  GASTROINTESTINAL: No nausea, vomiting, diarrhea or abdominal pain.  GENITOURINARY: No dysuria, hematuria.  ENDOCRINE: No polyuria, nocturia,  HEMATOLOGY: No anemia, easy bruising or bleeding SKIN: No rash or lesion.  MUSCULOSKELETAL: No joint pain or arthritis.   NEUROLOGIC: No tingling, numbness, weakness.  PSYCHIATRY: No anxiety or depression.   DRUG ALLERGIES:   Allergies  Allergen Reactions  . Latex Rash  . Levofloxacin Rash  . Sulfa Antibiotics Rash    VITALS:  Blood pressure 104/63, pulse 51, temperature 98.4 F (36.9 C), temperature source Oral, resp. rate 22, height  (1.626 m), weight 54.341 kg (119 lb 12.8 oz), SpO2 99 %.  PHYSICAL EXAMINATION:  GENERAL:  81 y.o.-year-old patient lying in the bed with no acute distress.  EYES: Pupils equal, round, reactive to light and accommodation. No scleral icterus. Extraocular muscles intact.  HEENT: Head atraumatic, normocephalic. Oropharynx and nasopharynx clear. Dry oral mucosa. NECK:  Supple, no jugular venous distention. No thyroid enlargement, no tenderness.  LUNGS: Normal breath sounds bilaterally, no wheezing, rales,rhonchi or crepitation. No use of accessory muscles of respiration.  CARDIOVASCULAR: S1, S2 normal. Systolic murmurs 3/6, no rubs, or gallops.  ABDOMEN: Soft, nontender, nondistended. Bowel sounds present. No organomegaly  or mass.  EXTREMITIES: right leg in dressing, left leg edema with erythema,  No cyanosis, or clubbing.  NEUROLOGIC: Cranial nerves II through XII are intact. Muscle strength 4/5 in all extremities. Sensation intact. Gait not checked.  PSYCHIATRIC: The patient is alert and oriented x 3.  SKIN: No obvious rash, lesion, or ulcer.   LABORATORY PANEL:   CBC  Recent Labs Lab 01/13/15 0445  WBC 4.3  HGB 8.1*  HCT 25.6*  PLT 62*   ------------------------------------------------------------------------------------------------------------------  Chemistries   Recent Labs Lab 01/10/15 1947  01/12/15 0502 01/13/15 0445  NA 131*  < > 137 139  K 6.2*  < > 3.4* 4.3  CL 91*  < > 97* 98*  CO2 30  < > 31 33*  GLUCOSE 104*  < > 93 106*  BUN 103*  < > 90* 80*  CREATININE 2.62*  < > 2.10* 1.95*  CALCIUM 8.6*  < > 8.1* 7.9*  MG  --   --  1.8  --   AST 134*  --   --   --   ALT 84*  --   --   --   ALKPHOS 119  --   --   --   BILITOT 1.5*  --   --   --   < > = values in this interval not displayed. ------------------------------------------------------------------------------------------------------------------  Cardiac Enzymes No results for input(s): TROPONINI in the last 168 hours. ------------------------------------------------------------------------------------------------------------------  RADIOLOGY:  US Renal  01/11/2015   CLINICAL DATA:  Acute renal failure.  EXAM: RENAL / URINARY TRACT ULTRASOUND COMPLETE  COMPARISON:  None.  FINDINGS: Right Kidney:  Length: 8.7 cm. Parenchymal echogenicity is increased.  No mass or hydronephrosis.  Left Kidney:  Length: 8.9 cm. Parenchymal echogenicity is increased. No mass or hydronephrosis.  Bladder:  Ureteral jets are seen bilaterally.  IMPRESSION: Increased renal parenchymal echogenicity is in keeping with chronic medical renal disease. No acute findings.   Electronically Signed   By: Leanna Battles M.D.   On: 01/11/2015 13:18    EKG:    Orders placed or performed during the hospital encounter of 01/10/15  . EKG 12-Lead  . EKG 12-Lead    ASSESSMENT AND PLAN:   Acute on chronic kidney failure (currently stage 4).  Slowly improving.  Continue gentle rehydration and follow-up BMP. Hold Lasix.  Hypokalemia. Improved after supplement. Hyponatremia. Improved  Wound of left lower extremity. Wound culture showed Escherichia coli and Proteus Mirabilis, discontinue vancomycin and zosyn, start Rocephin. continue wound care.   Elevated INR - INR 3.05, hold warfarin, follow-up INR, warfarin per pharmacy dosing. Atrial fibrillation - anticoagulation as above, continue Lopressor. Chronic systolic congestive heart failure, stable. hold diuretics and continue current treatment per Dr. Juliann Pares.   Essential hypertension -controlled. continue Lopressor. COPD (chronic obstructive pulmonary disease) - stable. continue home inhalers  Hypoxia.  The patient has no complaints of shortness of breath, lung sounds are clear. But overall saturation decreased to 80s, all oxygen 3 L. I will decrease IV fluid rate to 50 cc per hour and get an chest x-ray. Continue nebulizer treatment.  Anemia of chronic disease. Stable. Chronic thrombocytopenia. Stable  Continue PT.  The patient need home health and PT.  All the records are reviewed and case discussed with Care Management/Social Workerr. Management plans discussed with the patient, the patient's son and they are in agreement.  CODE STATUS: Full code.  TOTAL TIME TAKING CARE OF THIS PATIENT: 37   minutes.   POSSIBLE D/C IN 3-4 DAYS, DEPENDING ON CLINICAL CONDITION.   Shaune Pollack M.D on 01/13/2015 at 11:33 AM  Between 7am to 6pm - Pager - [phone]  After 6pm go to [email or web address] - password [email or web address] [facility]  [facility]  Office  [phone]  CC: Primary care physician; Marisue Ivan, MD

## 2015-01-14 LAB — BASIC METABOLIC PANEL
ANION GAP: 13 (ref 5–15)
BUN: 76 mg/dL — ABNORMAL HIGH (ref 6–20)
CO2: 30 mmol/L (ref 22–32)
CREATININE: 1.66 mg/dL — AB (ref 0.44–1.00)
Calcium: 8.1 mg/dL — ABNORMAL LOW (ref 8.9–10.3)
Chloride: 96 mmol/L — ABNORMAL LOW (ref 101–111)
GFR calc non Af Amer: 27 mL/min — ABNORMAL LOW (ref >60)
GFR, EST AFRICAN AMERICAN: 31 mL/min — AB (ref >60)
Glucose, Bld: 105 mg/dL — ABNORMAL HIGH (ref 65–99)
Potassium: 3.6 mmol/L (ref 3.5–5.1)
SODIUM: 139 mmol/L (ref 135–145)

## 2015-01-14 LAB — CBC WITH DIFFERENTIAL/PLATELET
BAND NEUTROPHILS: 12 % — AB (ref 0–10)
BASOS PCT: 0 % (ref 0–1)
Basophils Absolute: 0 10*3/uL (ref 0.0–0.1)
Blasts: 0 %
EOS ABS: 0 10*3/uL (ref 0.0–0.7)
Eosinophils Relative: 1 % (ref 0–5)
HEMATOCRIT: 24.6 % — AB (ref 35.0–47.0)
HEMOGLOBIN: 7.9 g/dL — AB (ref 12.0–16.0)
Lymphocytes Relative: 18 % (ref 12–46)
Lymphs Abs: 0.8 10*3/uL (ref 0.7–4.0)
MCH: 30.8 pg (ref 26.0–34.0)
MCHC: 32 g/dL (ref 32.0–36.0)
MCV: 96.4 fL (ref 80.0–100.0)
MYELOCYTES: 0 %
Metamyelocytes Relative: 0 %
Monocytes Absolute: 0 10*3/uL — ABNORMAL LOW (ref 0.1–1.0)
Monocytes Relative: 1 % — ABNORMAL LOW (ref 3–12)
Neutro Abs: 3.7 10*3/uL (ref 1.7–7.7)
Neutrophils Relative %: 68 % (ref 43–77)
Other: 0 %
PROMYELOCYTES ABS: 0 %
Platelets: 60 10*3/uL — ABNORMAL LOW (ref 150–440)
RBC: 2.56 MIL/uL — ABNORMAL LOW (ref 3.80–5.20)
RDW: 18.6 % — ABNORMAL HIGH (ref 11.5–14.5)
WBC: 4.5 10*3/uL (ref 3.6–11.0)
nRBC: 0 /100 WBC

## 2015-01-14 LAB — WOUND CULTURE: Special Requests: NORMAL

## 2015-01-14 LAB — PROTIME-INR
INR: 2.51
PROTHROMBIN TIME: 27.2 s — AB (ref 11.4–15.0)

## 2015-01-14 MED ORDER — VANCOMYCIN HCL IN DEXTROSE 1-5 GM/200ML-% IV SOLN
1000.0000 mg | INTRAVENOUS | Status: DC
  Administered 2015-01-14: 1000 mg via INTRAVENOUS
  Filled 2015-01-14: qty 200

## 2015-01-14 MED ORDER — WARFARIN SODIUM 2 MG PO TABS
1.0000 mg | ORAL_TABLET | Freq: Every day | ORAL | Status: DC
  Administered 2015-01-14: 1 mg via ORAL
  Filled 2015-01-14: qty 1

## 2015-01-14 MED ORDER — DOXYCYCLINE HYCLATE 100 MG PO TABS
100.0000 mg | ORAL_TABLET | Freq: Two times a day (BID) | ORAL | Status: DC
  Administered 2015-01-14 – 2015-01-19 (×11): 100 mg via ORAL
  Filled 2015-01-14 (×11): qty 1

## 2015-01-14 MED ORDER — CEPHALEXIN 500 MG PO CAPS
500.0000 mg | ORAL_CAPSULE | Freq: Three times a day (TID) | ORAL | Status: DC
  Administered 2015-01-14 – 2015-01-19 (×16): 500 mg via ORAL
  Filled 2015-01-14 (×16): qty 1

## 2015-01-14 NOTE — Progress Notes (Signed)
[facility] Physicians - [place] at [facility]   PATIENT NAME: Alicia Trujillo    MR#:  [id number]  DATE OF BIRTH:  Aug 01, 1924  SUBJECTIVE:  CHIEF COMPLAINT:   Chief Complaint  Patient presents with  . Wound Check   no complaint. On oxygen 3 L by nasal cannular.  REVIEW OF SYSTEMS:  CONSTITUTIONAL: No fever, generalized weakness.  EYES: No blurred or double vision.  EARS, NOSE, AND THROAT: No tinnitus or ear pain. Hard hearing. RESPIRATORY: has cough, but no shortness of breath, wheezing or hemoptysis.  CARDIOVASCULAR: No chest pain, orthopnea, but has leg edema.  GASTROINTESTINAL: No nausea, vomiting, diarrhea or abdominal pain.  GENITOURINARY: No dysuria, hematuria.  ENDOCRINE: No polyuria, nocturia,  HEMATOLOGY: No anemia, easy bruising or bleeding SKIN: No rash or lesion.  MUSCULOSKELETAL: No joint pain or arthritis.   NEUROLOGIC: No tingling, numbness, weakness.  PSYCHIATRY: No anxiety or depression.   DRUG ALLERGIES:   Allergies  Allergen Reactions  . Latex Rash  . Levofloxacin Rash  . Sulfa Antibiotics Rash    VITALS:  Blood pressure 98/56, pulse 99, temperature 97.5 F (36.4 C), temperature source Oral, resp. rate 20, height  (1.626 m), weight 53.57 kg (118 lb 1.6 oz), SpO2 94 %.  PHYSICAL EXAMINATION:  GENERAL:  81 y.o.-year-old patient lying in the bed with no acute distress.  EYES: Pupils equal, round, reactive to light and accommodation. No scleral icterus. Extraocular muscles intact.  HEENT: Head atraumatic, normocephalic. Hard hearing. Oropharynx and nasopharynx clear. Dry oral mucosa. NECK:  Supple, no jugular venous distention. No thyroid enlargement, no tenderness.  LUNGS: Normal breath sounds bilaterally, no wheezing, rales,rhonchi or crepitation. No use of accessory muscles of respiration.  CARDIOVASCULAR: S1, S2 normal. Systolic murmurs 2/6, no rubs, or gallops.  ABDOMEN: Soft, nontender, nondistended. Bowel sounds present. No  organomegaly or mass.  EXTREMITIES: right leg in dressing, better left leg edema with erythema,  No cyanosis, or clubbing.  NEUROLOGIC: Cranial nerves II through XII are intact. Muscle strength 4/5 in all extremities. Sensation intact. Gait not checked.  PSYCHIATRIC: The patient is alert and oriented x 3.  SKIN: No obvious rash, lesion, or ulcer.   LABORATORY PANEL:   CBC  Recent Labs Lab 01/14/15 0421  WBC 4.5  HGB 7.9*  HCT 24.6*  PLT 60*   ------------------------------------------------------------------------------------------------------------------  Chemistries   Recent Labs Lab 01/10/15 1947  01/12/15 0502  01/14/15 0421  NA 131*  < > 137  < > 139  K 6.2*  < > 3.4*  < > 3.6  CL 91*  < > 97*  < > 96*  CO2 30  < > 31  < > 30  GLUCOSE 104*  < > 93  < > 105*  BUN 103*  < > 90*  < > 76*  CREATININE 2.62*  < > 2.10*  < > 1.66*  CALCIUM 8.6*  < > 8.1*  < > 8.1*  MG  --   --  1.8  --   --   AST 134*  --   --   --   --   ALT 84*  --   --   --   --   ALKPHOS 119  --   --   --   --   BILITOT 1.5*  --   --   --   --   < > = values in this interval not displayed. ------------------------------------------------------------------------------------------------------------------  Cardiac Enzymes No results for input(s): TROPONINI in the  last 168 hours. ------------------------------------------------------------------------------------------------------------------  RADIOLOGY:  Dg Chest 1 View  01/13/2015   CLINICAL DATA:  Generalized weakness  EXAM: CHEST  1 VIEW  COMPARISON:  None.  FINDINGS: Cardiac shadow is enlarged. Small bilateral pleural effusions are noted. Slight increased density is noted in the right lung base consistent with atelectasis. No acute bony abnormality is noted.  IMPRESSION: Bilateral pleural effusions.  Right basilar atelectasis.  Cardiomegaly.   Electronically Signed   By: Alcide Clever M.D.   On: 01/13/2015 12:58    EKG:   Orders placed or  performed during the hospital encounter of 01/10/15  . EKG 12-Lead  . EKG 12-Lead    ASSESSMENT AND PLAN:   Acute on chronic kidney failure (currently stage 4).  improving.  Continue gentle rehydration and follow-up BMP. Hold Lasix.  Hypokalemia. Improved after supplement. Hyponatremia. Improved  Wound of left lower extremity. Wound culture showed Escherichia coli and Proteus Mirabilis and MRSA,  resume vancomycin and continue Rocephin. continue wound care. ID consult.   Elevated INR - therapeutic, resume warfarin dose per pharmacy, follow-up INR. Atrial fibrillation - anticoagulation as above, continue Lopressor.  Chronic systolic congestive heart failure, stable. hold diuretics.   Essential hypertension -controlled. continue Lopressor. COPD (chronic obstructive pulmonary disease) - stable. continue home inhalers  Hypoxia.  The patient has no complaints of shortness of breath, lung sounds are clear. On oxygen 3 L. chest x-ray: mild pleural effusion. Continue nebulizer treatment.  Anemia of chronic disease. Stable. Chronic thrombocytopenia. Stable  Continue PT.  The patient needs home health and PT.  All the records are reviewed and case discussed with Care Management/Social Workerr. Management plans discussed with the patient, the patient's son and they are in agreement.  CODE STATUS: Full code.  TOTAL TIME TAKING CARE OF THIS PATIENT: 37   minutes.   POSSIBLE D/C IN 3-4 DAYS, DEPENDING ON CLINICAL CONDITION.   Shaune Pollack M.D on 01/14/2015 at 12:54 PM  Between 7am to 6pm - Pager - [phone]  After 6pm go to [email or web address] - password [email or web address] [facility]  [place] [facility]  Office  [phone]  CC: Primary care physician; Marisue Ivan, MD

## 2015-01-14 NOTE — Consult Note (Signed)
[facility] Infectious Disease     Reason for Consult: LE wound   Referring Physician: Dr Bridgett Larsson Date of Admission:  01/10/2015   Principal Problem:   Acute on chronic kidney failure Active Problems:   Wound of left lower extremity   Atrial fibrillation   Chronic systolic congestive heart failure   Essential hypertension   COPD (chronic obstructive pulmonary disease)   Elevated INR   Gout  HPI: Alicia Trujillo is a 81 y.o. female admited 6/19 with wound on LE from ruptured hematoma after area of trauma to the leg and elevated INR on coumadin. . Per son was present for one -two weeks and had gone to ED but was not given abx, just wrapped. Following that it began to drain and smell bad per son so brought to ED and admitted. Has been on IV abx and getting dressing changes and wound improving.    Past Medical History  Diagnosis Date  . Asthma   . COPD (chronic obstructive pulmonary disease)   . A-fib   . Gout   . Essential hypertension   . Cardiomyopathy   . Chronic kidney disease   . Chronic systolic congestive heart failure   . Severe mitral regurgitation   . Mild aortic regurgitation   . Severe tricuspid regurgitation    Past Surgical History  Procedure Laterality Date  . Abdominal hysterectomy    . Bladder surgery     History  Substance Use Topics  . Smoking status: Former Research scientist (life sciences)  . Smokeless tobacco: Never Used  . Alcohol Use: No   Family History  Problem Relation Age of Onset  . Asthma Father   . Lung cancer Brother     Allergies:  Allergies  Allergen Reactions  . Latex Rash  . Levofloxacin Rash  . Sulfa Antibiotics Rash   Current antibiotics: Antibiotics Given (last 72 hours)    Date/Time Action Medication Dose Rate   01/12/15 1513 Given   vancomycin (VANCOCIN) IVPB 750 mg/150 ml premix 750 mg 150 mL/hr   01/12/15 2252 Given   piperacillin-tazobactam (ZOSYN) IVPB 3.375 g 3.375 g 12.5 mL/hr   01/13/15 0943 Given   piperacillin-tazobactam (ZOSYN)  IVPB 3.375 g 3.375 g 12.5 mL/hr   01/13/15 1406 Given   cefTRIAXone (ROCEPHIN) 1 g in dextrose 5 % 50 mL IVPB - Premix 1 g 100 mL/hr   01/14/15 1150 Given   cefTRIAXone (ROCEPHIN) 1 g in dextrose 5 % 50 mL IVPB - Premix 1 g 100 mL/hr      MEDICATIONS: . allopurinol  100 mg Oral Daily  . cefTRIAXone (ROCEPHIN)  IV  1 g Intravenous Q24H  . collagenase   Topical Daily  . feeding supplement (ENSURE ENLIVE)  237 mL Oral Q1500  . metoprolol tartrate  12.5 mg Oral BID  . mometasone-formoterol  2 puff Inhalation BID  . montelukast  10 mg Oral Daily  . sodium chloride  3 mL Intravenous Q12H  . vancomycin  1,000 mg Intravenous Q48H  . warfarin  1 mg Oral q1800    Review of Systems - 11 systems reviewed and negative per HPI   OBJECTIVE: Temp:  [97.5 F (36.4 C)-98.1 F (36.7 C)] 97.5 F (36.4 C) (06/23 0803) Pulse Rate:  [42-99] 99 (06/23 1131) Resp:  [16-24] 20 (06/23 0120) BP: (82-150)/(51-130) 98/56 mmHg (06/23 0826) SpO2:  [75 %-100 %] 94 % (06/23 1131) Weight:  [53.57 kg (118 lb 1.6 oz)] 53.57 kg (118 lb 1.6 oz) (06/23 0120) Constitutional: She is oriented  to person, place, and time. She appears well-developed and well-nourished. No distress.  HENT:  Head: Normocephalic and atraumatic.  Mouth/Throat: Oropharynx is clear and moist.  Heart of hearing, chronic problem  Eyes: Conjunctivae and EOM are normal. Pupils are equal, round, and reactive to light. No scleral icterus.  Neck: Normal range of motion. Neck supple. No JVD present. No thyromegaly present.  Cardiovascular: Normal rate and intact distal pulses. Exam reveals no gallop and no friction rub.  Murmur (3/6 systolic murmur) heard. Irregular  Respiratory: Effort normal and breath sounds normal. No respiratory distress. She has no wheezes. She has no rales.  GI: Soft. Bowel sounds are normal. She exhibits no distension. There is no tenderness.  Musculoskeletal: Normal range of motion. She exhibits no edema.  Left  lower extremity wound with clean base mild surrounding erythema Lymphadenopathy:   She has no cervical adenopathy.  Neurological: She is alert and oriented to person, place, and time. No cranial nerve deficit.  No dysarthria, no aphasia  Skin: Skin is warm and dry. No rash noted. There is erythema.  Mild surrounding the left lower extremity bleeding  Psychiatric: She has a normal mood and affect. Her behavior is normal. Judgment and thought content normal.  LABS: Results for orders placed or performed during the hospital encounter of 01/10/15 (from the past 48 hour(s))  Protime-INR     Status: Abnormal   Collection Time: 01/13/15  4:45 AM  Result Value Ref Range   Prothrombin Time 31.7 (H) 11.4 - 15.0 seconds   INR 6.94   Basic metabolic panel     Status: Abnormal   Collection Time: 01/13/15  4:45 AM  Result Value Ref Range   Sodium 139 135 - 145 mmol/L   Potassium 4.3 3.5 - 5.1 mmol/L   Chloride 98 (L) 101 - 111 mmol/L   CO2 33 (H) 22 - 32 mmol/L   Glucose, Bld 106 (H) 65 - 99 mg/dL   BUN 80 (H) 6 - 20 mg/dL   Creatinine, Ser 1.95 (H) 0.44 - 1.00 mg/dL   Calcium 7.9 (L) 8.9 - 10.3 mg/dL   GFR calc non Af Amer 22 (L) >60 mL/min   GFR calc Af Amer 25 (L) >60 mL/min    Comment: (NOTE) The eGFR has been calculated using the CKD EPI equation. This calculation has not been validated in all clinical situations. eGFR's persistently <60 mL/min signify possible Chronic Kidney Disease.    Anion gap 8 5 - 15  CBC with Differential/Platelet     Status: Abnormal   Collection Time: 01/13/15  4:45 AM  Result Value Ref Range   WBC 4.3 3.6 - 11.0 K/uL   RBC 2.64 (L) 3.80 - 5.20 MIL/uL   Hemoglobin 8.1 (L) 12.0 - 16.0 g/dL   HCT 25.6 (L) 35.0 - 47.0 %   MCV 96.9 80.0 - 100.0 fL   MCH 30.6 26.0 - 34.0 pg   MCHC 31.6 (L) 32.0 - 36.0 g/dL   RDW 18.8 (H) 11.5 - 14.5 %   Platelets 62 (L) 150 - 440 K/uL   Neutrophils Relative % 75 43 - 77 %   Lymphocytes Relative 12 12 - 46 %   Monocytes  Relative 11 3 - 12 %   Eosinophils Relative 2 0 - 5 %   Basophils Relative 0 0 - 1 %   Band Neutrophils 0 0 - 10 %   Metamyelocytes Relative 0 %   Myelocytes 0 %   Promyelocytes Absolute 0 %  Blasts 0 %   nRBC 3 (H) 0 /100 WBC   Other 0 %   Neutro Abs 3.2 1.7 - 7.7 K/uL   Lymphs Abs 0.5 (L) 0.7 - 4.0 K/uL   Monocytes Absolute 0.5 0.1 - 1.0 K/uL   Eosinophils Absolute 0.1 0.0 - 0.7 K/uL   Basophils Absolute 0.0 0.0 - 0.1 K/uL  CBC with Differential/Platelet     Status: Abnormal   Collection Time: 01/14/15  4:21 AM  Result Value Ref Range   WBC 4.5 3.6 - 11.0 K/uL   RBC 2.56 (L) 3.80 - 5.20 MIL/uL   Hemoglobin 7.9 (L) 12.0 - 16.0 g/dL   HCT 24.6 (L) 35.0 - 47.0 %   MCV 96.4 80.0 - 100.0 fL   MCH 30.8 26.0 - 34.0 pg   MCHC 32.0 32.0 - 36.0 g/dL   RDW 18.6 (H) 11.5 - 14.5 %   Platelets 60 (L) 150 - 440 K/uL   Neutrophils Relative % 68 43 - 77 %   Lymphocytes Relative 18 12 - 46 %   Monocytes Relative 1 (L) 3 - 12 %   Eosinophils Relative 1 0 - 5 %   Basophils Relative 0 0 - 1 %   Band Neutrophils 12 (H) 0 - 10 %   Metamyelocytes Relative 0 %   Myelocytes 0 %   Promyelocytes Absolute 0 %   Blasts 0 %   nRBC 0 0 /100 WBC   Other 0 %   Neutro Abs 3.7 1.7 - 7.7 K/uL   Lymphs Abs 0.8 0.7 - 4.0 K/uL   Monocytes Absolute 0.0 (L) 0.1 - 1.0 K/uL   Eosinophils Absolute 0.0 0.0 - 0.7 K/uL   Basophils Absolute 0.0 0.0 - 0.1 K/uL   RBC Morphology RARE NRBCs     Comment: POLYCHROMASIA PRESENT HYPOCHROMIC   Protime-INR     Status: Abnormal   Collection Time: 01/14/15  4:21 AM  Result Value Ref Range   Prothrombin Time 27.2 (H) 11.4 - 15.0 seconds   INR 2.09   Basic metabolic panel     Status: Abnormal   Collection Time: 01/14/15  4:21 AM  Result Value Ref Range   Sodium 139 135 - 145 mmol/L   Potassium 3.6 3.5 - 5.1 mmol/L   Chloride 96 (L) 101 - 111 mmol/L   CO2 30 22 - 32 mmol/L   Glucose, Bld 105 (H) 65 - 99 mg/dL   BUN 76 (H) 6 - 20 mg/dL   Creatinine, Ser 1.66 (H)  0.44 - 1.00 mg/dL   Calcium 8.1 (L) 8.9 - 10.3 mg/dL   GFR calc non Af Amer 27 (L) >60 mL/min   GFR calc Af Amer 31 (L) >60 mL/min    Comment: (NOTE) The eGFR has been calculated using the CKD EPI equation. This calculation has not been validated in all clinical situations. eGFR's persistently <60 mL/min signify possible Chronic Kidney Disease.    Anion gap 13 5 - 15   No components found for: ESR, C REACTIVE PROTEIN MICRO: Recent Results (from the past 720 hour(s))  Wound culture     Status: None   Collection Time: 01/10/15  8:36 PM  Result Value Ref Range Status   Specimen Description LEG LEFT LEG  Final   Special Requests Normal  Final   Gram Stain   Final    RARE WBC SEEN RARE GRAM POSITIVE COCCI RARE GRAM NEGATIVE RODS    Culture   Final    MODERATE GROWTH PROTEUS  MIRABILIS LIGHT GROWTH ESCHERICHIA COLI LIGHT GROWTH METHICILLIN RESISTANT STAPHYLOCOCCUS AUREUS LIGHT GROWTH GROUP B STREP(S.AGALACTIAE)ISOLATED Virtually 100% of S. agalactiae (Group B) strains are susceptible to Penicillin.  For Penicillin-allergic patients, Erythromycin (85-95% sensitive) and Clindamycin (80% sensitive) are drugs of choice. Contact microbiology lab to request sensitivities if  needed within 7 days.    Report Status 01/14/2015 FINAL  Final   Organism ID, Bacteria PROTEUS MIRABILIS  Final   Organism ID, Bacteria ESCHERICHIA COLI  Final   Organism ID, Bacteria METHICILLIN RESISTANT STAPHYLOCOCCUS AUREUS  Final      Susceptibility   Escherichia coli - MIC*    AMPICILLIN >=32 RESISTANT Resistant     CEFAZOLIN <=4 SENSITIVE Sensitive     CEFTRIAXONE <=1 SENSITIVE Sensitive     CIPROFLOXACIN >=4 RESISTANT Resistant     GENTAMICIN >=16 RESISTANT Resistant     IMIPENEM <=0.25 SENSITIVE Sensitive     TRIMETH/SULFA >=320 RESISTANT Resistant     CEFOXITIN <=4 SENSITIVE Sensitive     PIP/TAZO Value in next row Sensitive      SENSITIVE<=4    * LIGHT GROWTH ESCHERICHIA COLI   Proteus mirabilis -  MIC*    AMPICILLIN Value in next row Sensitive      SENSITIVE<=4    CEFAZOLIN Value in next row Sensitive      SENSITIVE<=4    CEFTRIAXONE Value in next row Sensitive      SENSITIVE<=4    CIPROFLOXACIN Value in next row Sensitive      SENSITIVE<=4    GENTAMICIN Value in next row Sensitive      SENSITIVE<=4    IMIPENEM Value in next row Sensitive      SENSITIVE<=4    TRIMETH/SULFA Value in next row Sensitive      SENSITIVE<=4    CEFOXITIN Value in next row Sensitive      SENSITIVE<=4    PIP/TAZO Value in next row Sensitive      SENSITIVE<=4    * MODERATE GROWTH PROTEUS MIRABILIS   Methicillin resistant staphylococcus aureus - MIC*    CIPROFLOXACIN Value in next row Resistant      SENSITIVE<=4    GENTAMICIN Value in next row Sensitive      SENSITIVE<=4    OXACILLIN Value in next row Resistant      SENSITIVE<=4    VANCOMYCIN Value in next row Sensitive      SENSITIVE<=4    TRIMETH/SULFA Value in next row Sensitive      SENSITIVE<=4    CEFOXITIN SCREEN Value in next row Resistant      POSITIVECEFOXITIN SCREEN - This test may be used to predict mecA-mediated oxacillin resistance, and it is based on the cefoxitin disk screen test.  The cefoxitin screen and oxacillin work in combination to determine the final interpretation reported for oxacillin.     Inducible Clindamycin Value in next row Resistant      POSITIVEINDUCIBLE CLINDAMYCIN RESISTANCE - A positive ICR test is indicative of inducible resistance to macrolides, lincosamides, and type B streptogramin.  This isolate is presumed to be resistant to Clindamycin, however, Clindamycin may still be effective in some patients.     TETRACYCLINE Value in next row Sensitive      SENSITIVE<=1    CLINDAMYCIN Value in next row Resistant      RESISTANT>=8    LEVOFLOXACIN Value in next row Resistant      RESISTANT>=8    * LIGHT GROWTH METHICILLIN RESISTANT STAPHYLOCOCCUS AUREUS    IMAGING: Dg Chest 1 View  01/13/2015   CLINICAL DATA:   Generalized weakness  EXAM: CHEST  1 VIEW  COMPARISON:  None.  FINDINGS: Cardiac shadow is enlarged. Small bilateral pleural effusions are noted. Slight increased density is noted in the right lung base consistent with atelectasis. No acute bony abnormality is noted.  IMPRESSION: Bilateral pleural effusions.  Right basilar atelectasis.  Cardiomegaly.   Electronically Signed   By: Inez Catalina M.D.   On: 01/13/2015 12:58   Dg Tibia/fibula Left  01/10/2015   CLINICAL DATA:  Anterior wound with bruising and swelling  EXAM: LEFT TIBIA AND FIBULA - 2 VIEW  COMPARISON:  None.  FINDINGS: Frontal and lateral views were obtained. There is a bandage over the mid tibial region medially. There is no fracture or dislocation. No erosive change or bony destruction. There is mild benign-appearing endosteal thickening along the anterior and medial mid tibia, likely reactive due to trauma in this area.  IMPRESSION: Mild benignendosteal thickening mid tibia. No erosive change or bony destruction. No abnormal periosteal reaction. No fracture or dislocation. No soft tissue air seen.   Electronically Signed   By: Lowella Grip III M.D.   On: 01/10/2015 20:29   US Renal  01/11/2015   CLINICAL DATA:  Acute renal failure.  EXAM: RENAL / URINARY TRACT ULTRASOUND COMPLETE  COMPARISON:  None.  FINDINGS: Right Kidney:  Length: 8.7 cm. Parenchymal echogenicity is increased. No mass or hydronephrosis.  Left Kidney:  Length: 8.9 cm. Parenchymal echogenicity is increased. No mass or hydronephrosis.  Bladder:  Ureteral jets are seen bilaterally.  IMPRESSION: Increased renal parenchymal echogenicity is in keeping with chronic medical renal disease. No acute findings.   Electronically Signed   By: Lorin Picket M.D.   On: 01/11/2015 13:18    Assessment:   Alicia Trujillo is a 81 y.o. female with LE wound following hematoma at site from elevated INR and bumping her leg.  Currently much improved. Cx with Proteus, E coli and  MRSA.  Recommendations Change to keflex 500 tid and doxy 100 bid to cover the organisms on culture - Orders entered and I have dced vanco and ceftraixone. Continue local wound care. Will need HH if goes home for dressing changes.  Would give another 7 days of oral abx (stop date 6/30) I will sign off but please call with questions Thank you very much for allowing me to participate in the care of this patient. Please call with questions.   Cheral Marker. Ola Spurr, MD

## 2015-01-14 NOTE — Progress Notes (Signed)
ANTIBIOTIC CONSULT NOTE - FOLLOW UP  Pharmacy Consult for Vancomycin Indication: wound infection  Allergies  Allergen Reactions  . Latex Rash  . Levofloxacin Rash  . Sulfa Antibiotics Rash    Patient Measurements: Height: 5\' 4"  (162.6 cm) Weight: 118 lb 1.6 oz (53.57 kg) IBW/kg (Calculated) : 54.7  Labs:  Recent Labs  01/12/15 0502 01/13/15 0445 01/14/15 0421  WBC 5.4 4.3 4.5  HGB 8.2* 8.1* 7.9*  PLT 70* 62* 60*  CREATININE 2.10* 1.95* 1.66*   Estimated Creatinine Clearance: 20.2 mL/min (by C-G formula based on Cr of 1.66).   Microbiology: Recent Results (from the past 720 hour(s))  Wound culture     Status: None   Collection Time: 01/10/15  8:36 PM  Result Value Ref Range Status   Specimen Description LEG LEFT LEG  Final   Special Requests Normal  Final   Gram Stain   Final    RARE WBC SEEN RARE GRAM POSITIVE COCCI RARE GRAM NEGATIVE RODS    Culture   Final    MODERATE GROWTH PROTEUS MIRABILIS LIGHT GROWTH ESCHERICHIA COLI LIGHT GROWTH METHICILLIN RESISTANT STAPHYLOCOCCUS AUREUS LIGHT GROWTH GROUP B STREP(S.AGALACTIAE)ISOLATED Virtually 100% of S. agalactiae (Group B) strains are susceptible to Penicillin.  For Penicillin-allergic patients, Erythromycin (85-95% sensitive) and Clindamycin (80% sensitive) are drugs of choice. Contact microbiology lab to request sensitivities if  needed within 7 days.    Report Status 01/14/2015 FINAL  Final   Organism ID, Bacteria PROTEUS MIRABILIS  Final   Organism ID, Bacteria ESCHERICHIA COLI  Final   Organism ID, Bacteria METHICILLIN RESISTANT STAPHYLOCOCCUS AUREUS  Final      Susceptibility   Escherichia coli - MIC*    AMPICILLIN >=32 RESISTANT Resistant     CEFAZOLIN <=4 SENSITIVE Sensitive     CEFTRIAXONE <=1 SENSITIVE Sensitive     CIPROFLOXACIN >=4 RESISTANT Resistant     GENTAMICIN >=16 RESISTANT Resistant     IMIPENEM <=0.25 SENSITIVE Sensitive     TRIMETH/SULFA >=320 RESISTANT Resistant     CEFOXITIN <=4  SENSITIVE Sensitive     PIP/TAZO Value in next row Sensitive      SENSITIVE<=4    * LIGHT GROWTH ESCHERICHIA COLI   Proteus mirabilis - MIC*    AMPICILLIN Value in next row Sensitive      SENSITIVE<=4    CEFAZOLIN Value in next row Sensitive      SENSITIVE<=4    CEFTRIAXONE Value in next row Sensitive      SENSITIVE<=4    CIPROFLOXACIN Value in next row Sensitive      SENSITIVE<=4    GENTAMICIN Value in next row Sensitive      SENSITIVE<=4    IMIPENEM Value in next row Sensitive      SENSITIVE<=4    TRIMETH/SULFA Value in next row Sensitive      SENSITIVE<=4    CEFOXITIN Value in next row Sensitive      SENSITIVE<=4    PIP/TAZO Value in next row Sensitive      SENSITIVE<=4    * MODERATE GROWTH PROTEUS MIRABILIS   Methicillin resistant staphylococcus aureus - MIC*    CIPROFLOXACIN Value in next row Resistant      SENSITIVE<=4    GENTAMICIN Value in next row Sensitive      SENSITIVE<=4    OXACILLIN Value in next row Resistant      SENSITIVE<=4    VANCOMYCIN Value in next row Sensitive      SENSITIVE<=4    TRIMETH/SULFA Value in next  row Sensitive      SENSITIVE<=4    CEFOXITIN SCREEN Value in next row Resistant      POSITIVECEFOXITIN SCREEN - This test may be used to predict mecA-mediated oxacillin resistance, and it is based on the cefoxitin disk screen test.  The cefoxitin screen and oxacillin work in combination to determine the final interpretation reported for oxacillin.     Inducible Clindamycin Value in next row Resistant      POSITIVEINDUCIBLE CLINDAMYCIN RESISTANCE - A positive ICR test is indicative of inducible resistance to macrolides, lincosamides, and type B streptogramin.  This isolate is presumed to be resistant to Clindamycin, however, Clindamycin may still be effective in some patients.     TETRACYCLINE Value in next row Sensitive      SENSITIVE<=1    CLINDAMYCIN Value in next row Resistant      RESISTANT>=8    LEVOFLOXACIN Value in next row Resistant       RESISTANT>=8    * LIGHT GROWTH METHICILLIN RESISTANT STAPHYLOCOCCUS AUREUS    Anti-infectives    Start     Dose/Rate Route Frequency Ordered Stop   01/14/15 1000  vancomycin (VANCOCIN) IVPB 1000 mg/200 mL premix     1,000 mg 200 mL/hr over 60 Minutes Intravenous Every 48 hours 01/14/15 0937     01/13/15 1145  cefTRIAXone (ROCEPHIN) 1 g in dextrose 5 % 50 mL IVPB - Premix     1 g 100 mL/hr over 30 Minutes Intravenous Every 24 hours 01/13/15 1144     01/12/15 2100  vancomycin (VANCOCIN) IVPB 1000 mg/200 mL premix  Status:  Discontinued     1,000 mg 200 mL/hr over 60 Minutes Intravenous every 72 hours 01/11/15 0200 01/11/15 1145   01/12/15 1600  vancomycin (VANCOCIN) IVPB 750 mg/150 ml premix  Status:  Discontinued     750 mg 150 mL/hr over 60 Minutes Intravenous Every 48 hours 01/11/15 1145 01/13/15 1144   01/12/15 1430  piperacillin-tazobactam (ZOSYN) IVPB 3.375 g  Status:  Discontinued     3.375 g 12.5 mL/hr over 240 Minutes Intravenous Every 12 hours 01/12/15 1359 01/13/15 1144   01/11/15 2200  vancomycin (VANCOCIN) IVPB 1000 mg/200 mL premix  Status:  Discontinued     1,000 mg 200 mL/hr over 60 Minutes Intravenous  Once 01/11/15 0120 01/11/15 0200   01/10/15 2215  vancomycin (VANCOCIN) IVPB 1000 mg/200 mL premix     1,000 mg 200 mL/hr over 60 Minutes Intravenous  Once 01/10/15 2202 01/10/15 2319      Assessment: Patient previously on vancomycin/zosyn for wound infection. Deescalated to ceftriaxone alone 6/22, resuming vancomycin today due to MRSA in wound culture.  Wound Cx (6/19): MRSA, e.coli, proteus, grp B strep - sensitivities as above  PK data (calculated): Ke: 0.021 hr^-1, t1/2: 33h, Vd: 38L  Plan:  Ordered vancomycin 1gm IV Q48H. Will check trough prior to second dose due to duration of therapy. This will not be at steady state.   Continue ceftriaxone 1gm IV Q24H  Pharmacy to follow per consult  Garlon Hatchet, PharmD Clinical Pharmacist  01/14/2015,9:39  AM

## 2015-01-14 NOTE — Progress Notes (Signed)
ANTICOAGULATION CONSULT NOTE   Pharmacy Consult for warfarin dosing Indication: atrial fibrillation  Allergies  Allergen Reactions  . Latex Rash  . Levofloxacin Rash  . Sulfa Antibiotics Rash    Patient Measurements: Height: 5\' 4"  (162.6 cm) Weight: 118 lb 1.6 oz (53.57 kg) IBW/kg (Calculated) : 54.7 Heparin Dosing Weight: n/a  Vital Signs: Temp: 97.6 F (36.4 C) (06/23 0120) Temp Source: Oral (06/23 0120) BP: 92/62 mmHg (06/23 0143) Pulse Rate: 65 (06/23 0120)  Labs:  Recent Labs  01/12/15 0502 01/13/15 0445 01/14/15 0421  HGB 8.2* 8.1*  --   HCT 25.4* 25.6*  --   PLT 70* 62*  --   LABPROT 32.4* 31.7* 27.2*  INR 3.15 3.06 2.51  CREATININE 2.10* 1.95* 1.66*    Estimated Creatinine Clearance: 20.2 mL/min (by C-G formula based on Cr of 1.66).   Medical History: Past Medical History  Diagnosis Date  . Asthma   . COPD (chronic obstructive pulmonary disease)   . A-fib   . Gout   . Essential hypertension   . Cardiomyopathy   . Chronic kidney disease   . Chronic systolic congestive heart failure   . Severe mitral regurgitation   . Mild aortic regurgitation   . Severe tricuspid regurgitation     Home med:  Coumadin 2mg  daily.  Assessment: 6/19 INR = 3.34, no coumadin given. 6/20 INR = 3.14, no coumadin given. 6/21 INR = 3.15, no coumadin given. 6/22 INR = 3.05, no coumadin given. 6/23 INR = 2.51  Goal of Therapy:  INR 2-3   Plan:    Will restart Coumadin at 1mg  daily.  Will check INR in the morning and reassess therapy.   Stormy Card, [place]   Clinical Pharmacist 01/14/2015

## 2015-01-14 NOTE — Progress Notes (Signed)
Physical Therapy Treatment Patient Details Name: Alicia Trujillo MRN: [id number] DOB: Apr 23, 1925 Today's Date: 01/14/2015    History of Present Illness presented to ER from home secondary to L LE injury/drainage with foul odor; admitted with L LE infection/wound and acute kidney failure.    PT Comments    Pt now on isolation precautions. Pt more fatigued today. Noted low blood pressure/higher HR today. Pt agreeable to exercises in the chair today.   Follow Up Recommendations  Home health PT     Equipment Recommendations       Recommendations for Other Services       Precautions / Restrictions Restrictions Weight Bearing Restrictions: No    Mobility  Bed Mobility               General bed mobility comments: Not tested; up in chair  Transfers                 General transfer comment: Not tested; low BP this a.m and pt wishes to remain in chair  Ambulation/Gait                 Stairs            Wheelchair Mobility    Modified Rankin (Stroke Patients Only)       Balance                                    Cognition Arousal/Alertness: Awake/alert Behavior During Therapy: WFL for tasks assessed/performed Overall Cognitive Status: Within Functional Limits for tasks assessed                      Exercises General Exercises - Lower Extremity Ankle Circles/Pumps: AROM;Both;20 reps;Seated Quad Sets: Strengthening;Both;20 reps;Seated Gluteal Sets: Strengthening;Both;20 reps;Seated Short Arc Quad: AROM;Both;20 reps;Seated Long Arc Quad: AROM;Both;Seated;15 reps Heel Slides: AAROM;Both;20 reps;Seated Hip ABduction/ADduction: AAROM;Both;20 reps;Seated Straight Leg Raises: AAROM;Both;15 reps;Seated Hip Flexion/Marching: AROM;Both;20 reps;Seated    General Comments        Pertinent Vitals/Pain Pain Assessment: No/denies pain    Home Living                      Prior Function            PT Goals  (current goals can now be found in the care plan section) Progress towards PT goals: Progressing toward goals    Frequency  Min 2X/week    PT Plan Current plan remains appropriate    Co-evaluation             End of Session   Activity Tolerance: Patient limited by fatigue Patient left: in chair;with chair alarm set     Time: 1131-1150 PT Time Calculation (min) (ACUTE ONLY): 19 min  Charges:  $Therapeutic Exercise: 8-22 mins                    G Codes:      [name] 01/14/2015, 12:38 PM

## 2015-01-15 LAB — BASIC METABOLIC PANEL
ANION GAP: 10 (ref 5–15)
BUN: 74 mg/dL — ABNORMAL HIGH (ref 6–20)
CHLORIDE: 96 mmol/L — AB (ref 101–111)
CO2: 29 mmol/L (ref 22–32)
Calcium: 7.9 mg/dL — ABNORMAL LOW (ref 8.9–10.3)
Creatinine, Ser: 1.56 mg/dL — ABNORMAL HIGH (ref 0.44–1.00)
GFR calc Af Amer: 33 mL/min — ABNORMAL LOW (ref >60)
GFR calc non Af Amer: 29 mL/min — ABNORMAL LOW (ref >60)
Glucose, Bld: 104 mg/dL — ABNORMAL HIGH (ref 65–99)
POTASSIUM: 3.7 mmol/L (ref 3.5–5.1)
Sodium: 135 mmol/L (ref 135–145)

## 2015-01-15 LAB — CBC WITH DIFFERENTIAL/PLATELET
BASOS ABS: 0 10*3/uL (ref 0–0.1)
Basophils Relative: 0 %
Eosinophils Absolute: 0.1 10*3/uL (ref 0–0.7)
Eosinophils Relative: 1 %
HCT: 27.4 % — ABNORMAL LOW (ref 35.0–47.0)
HEMOGLOBIN: 8.9 g/dL — AB (ref 12.0–16.0)
LYMPHS PCT: 17 %
Lymphs Abs: 0.9 10*3/uL — ABNORMAL LOW (ref 1.0–3.6)
MCH: 31.4 pg (ref 26.0–34.0)
MCHC: 32.5 g/dL (ref 32.0–36.0)
MCV: 96.7 fL (ref 80.0–100.0)
Monocytes Absolute: 0.6 10*3/uL (ref 0.2–0.9)
Monocytes Relative: 12 %
Neutro Abs: 3.6 10*3/uL (ref 1.4–6.5)
Neutrophils Relative %: 70 %
PLATELETS: 56 10*3/uL — AB (ref 150–440)
RBC: 2.84 MIL/uL — ABNORMAL LOW (ref 3.80–5.20)
RDW: 18.9 % — AB (ref 11.5–14.5)
WBC: 5.2 10*3/uL (ref 3.6–11.0)

## 2015-01-15 LAB — PROTIME-INR
INR: 1.86
Prothrombin Time: 21.6 seconds — ABNORMAL HIGH (ref 11.4–15.0)

## 2015-01-15 MED ORDER — ONDANSETRON HCL 4 MG/2ML IJ SOLN
4.0000 mg | Freq: Four times a day (QID) | INTRAMUSCULAR | Status: DC | PRN
  Administered 2015-01-15: 4 mg via INTRAVENOUS
  Filled 2015-01-15: qty 2

## 2015-01-15 MED ORDER — ENSURE ENLIVE PO LIQD
237.0000 mL | Freq: Two times a day (BID) | ORAL | Status: DC
  Administered 2015-01-15 – 2015-01-19 (×8): 237 mL via ORAL

## 2015-01-15 MED ORDER — WARFARIN SODIUM 2 MG PO TABS
2.0000 mg | ORAL_TABLET | Freq: Every day | ORAL | Status: DC
  Administered 2015-01-15 – 2015-01-18 (×4): 2 mg via ORAL
  Filled 2015-01-15 (×4): qty 1

## 2015-01-15 NOTE — Progress Notes (Signed)
[facility] -  at [facility]   PATIENT NAME: Alicia Trujillo    MR#:  [id number]  DATE OF BIRTH:  [birth year]  SUBJECTIVE:  CHIEF COMPLAINT:   Chief Complaint  Patient presents with  . Wound Check   feels congested,  On oxygen 3 L by nasal cannular.  REVIEW OF SYSTEMS:  CONSTITUTIONAL: No fever, generalized weakness.  EYES: No blurred or double vision.  EARS, NOSE, AND THROAT: No tinnitus or ear pain. Hard hearing. RESPIRATORY: has cough, but no shortness of breath, wheezing or hemoptysis.  CARDIOVASCULAR: No chest pain, orthopnea, but has leg edema.  GASTROINTESTINAL: No nausea, vomiting, diarrhea or abdominal pain.  GENITOURINARY: No dysuria, hematuria.  ENDOCRINE: No polyuria, nocturia,  HEMATOLOGY: No anemia, easy bruising or bleeding SKIN: No rash or lesion.  MUSCULOSKELETAL: No joint pain or arthritis.   NEUROLOGIC: No tingling, numbness, weakness.  PSYCHIATRY: No anxiety or depression.   DRUG ALLERGIES:   Allergies  Allergen Reactions  . Latex Rash  . Levofloxacin Rash  . Sulfa Antibiotics Rash    VITALS:  Blood pressure 126/62, pulse 88, temperature 98.1 F (36.7 C), temperature source Oral, resp. rate 18, height 5\' 4"  (1.626 m), weight 53.57 kg (118 lb 1.6 oz), SpO2 98 %.  PHYSICAL EXAMINATION:  GENERAL:  81 y.o.-year-old patient lying in the bed with no acute distress.  EYES: Pupils equal, round, reactive to light and accommodation. No scleral icterus. Extraocular muscles intact.  HEENT: Head atraumatic, normocephalic. Hard hearing. Oropharynx and nasopharynx clear. Dry oral mucosa. NECK:  Supple, no jugular venous distention. No thyroid enlargement, no tenderness.  LUNGS: Normal breath sounds bilaterally, no wheezing, rales,rhonchi or crepitation. No use of accessory muscles of respiration.  CARDIOVASCULAR: S1, S2 normal. Systolic murmurs 2/6, no rubs, or gallops.  ABDOMEN: Soft, nontender, nondistended. Bowel sounds present.  No organomegaly or mass.  EXTREMITIES: right leg in dressing, better left leg edema with erythema,  No cyanosis, or clubbing.  NEUROLOGIC: Cranial nerves II through XII are intact. Muscle strength 4/5 in all extremities. Sensation intact. Gait not checked.  PSYCHIATRIC: The patient is alert and oriented x 3.  SKIN: No obvious rash, lesion, or ulcer.   LABORATORY PANEL:   CBC  Recent Labs Lab 01/15/15 0451  WBC 5.2  HGB 8.9*  HCT 27.4*  PLT 56*   ------------------------------------------------------------------------------------------------------------------  Chemistries   Recent Labs Lab 01/10/15 1947  01/12/15 0502  01/15/15 0451  NA 131*  < > 137  < > 135  K 6.2*  < > 3.4*  < > 3.7  CL 91*  < > 97*  < > 96*  CO2 30  < > 31  < > 29  GLUCOSE 104*  < > 93  < > 104*  BUN 103*  < > 90*  < > 74*  CREATININE 2.62*  < > 2.10*  < > 1.56*  CALCIUM 8.6*  < > 8.1*  < > 7.9*  MG  --   --  1.8  --   --   AST 134*  --   --   --   --   ALT 84*  --   --   --   --   ALKPHOS 119  --   --   --   --   BILITOT 1.5*  --   --   --   --   < > = values in this interval not displayed. ------------------------------------------------------------------------------------------------------------------  Cardiac Enzymes No results for input(s): TROPONINI in  the last 168 hours. ------------------------------------------------------------------------------------------------------------------  RADIOLOGY:  No results found.  EKG:   Orders placed or performed during the hospital encounter of 01/10/15  . EKG 12-Lead  . EKG 12-Lead    ASSESSMENT AND PLAN:   Acute on chronic kidney failure (currently stage 4).  improving.  disontinue due to congestion.  follow-up BMP. Hold Lasix.  Hypokalemia. Improved after supplement. Hyponatremia. Improved  Wound of left lower extremity. Wound culture showed Escherichia coli and Proteus Mirabilis and MRSA,  Discontinued vancomycin and Rocephin.  continue wound care. Start Keflex and doxycycline for 7 more days per Dr. Sampson Goon.   Elevated INR - therapeutic, resumed warfarin dose per pharmacy, follow-up INR. Atrial fibrillation - anticoagulation as above, continue Lopressor.  Chronic systolic congestive heart failure, stable. hold diuretics.   Essential hypertension -controlled. continue Lopressor. COPD (chronic obstructive pulmonary disease) - stable. continue home inhalers  Hypoxia.  Possible due to above. Continue On oxygen 3 L and nebulizer treatment. Discontinue normal saline IV.  Anemia of chronic disease. Stable. Chronic thrombocytopenia. Stable  Continue PT.  The patient needs home health and PT.  All the records are reviewed and case discussed with Care Management/Social Workerr. Management plans discussed with the patient, the patient's son and they are in agreement.  CODE STATUS: Full code.  TOTAL TIME TAKING CARE OF THIS PATIENT: 37   minutes.   POSSIBLE D/C IN 3-4 DAYS, DEPENDING ON CLINICAL CONDITION.   Shaune Pollack M.D on 01/15/2015 at 1:28 PM  Between 7am to 6pm - Pager - [phone]  After 6pm go to [email or web address] - password [email or web address] [facility]  [place] [facility]  Office  [phone]  CC: Primary care physician; Marisue Ivan, MD

## 2015-01-15 NOTE — Progress Notes (Signed)
Notified Dr. Imogene Burn of pt nausea; Dr ordered zofran 4 mg IV q4 hrs prn; also notified Dr of pt's son concerns re pt's hearing and pt inhalers; pt's son stated that pt's hearing had decreased from what it normally is, that she usually is able to hear better; he also stated that pt usually takes advair; pt's son asked RN to notify Dr of these issues;  Dr acknowledged, stated that he had discussed these things w pt's son; no additional new orders

## 2015-01-15 NOTE — Progress Notes (Signed)
Physical Therapy Treatment Patient Details Name: Alicia Trujillo MRN: [id number] DOB: 1924-12-07 Today's Date: 01/15/2015    History of Present Illness presented to ER from home secondary to L LE injury/drainage with foul odor; admitted with L LE infection/wound and acute kidney failure.    PT Comments    Patient is an 81 y/o female that presents with LLE infection and acute renal failure, that has since been placed on isolation precautions. She is very hard of hearing, and therefore does not respond well to verbal cuing. She has also been started on O2 from the wall, but per the RN this may be more reflective of her poor circulation distally. Patient initially states she is too sick to ambulate, but is able to complete 2 laps from her chair to the door and back. While she fatigues quickly with this, she is safe with turns and is able to find her directions in the room. Skilled acute PT services continue to be indicated to address the above deficits.   Follow Up Recommendations  Home health PT     Equipment Recommendations       Recommendations for Other Services       Precautions / Restrictions Precautions Precautions: Fall Restrictions Weight Bearing Restrictions: No    Mobility  Bed Mobility               General bed mobility comments: Not tested; up in chair  Transfers Overall transfer level: Needs assistance Equipment used: Rolling walker (2 wheeled) Transfers: Sit to/from Stand Sit to Stand: Min assist         General transfer comment: Patient required extra time to complete transfer as she is weak in her bilateral LEs.   Ambulation/Gait Ambulation/Gait assistance: Supervision Ambulation Distance (Feet): 60 Feet Assistive device: Rolling walker (2 wheeled) Gait Pattern/deviations: Step-through pattern;Decreased stride length;Trunk flexed Gait velocity: Decreased gait speed for her age.    General Gait Details: Patient has been on 3L of O2 today, she did  not become dyspnic on exertion. Per RN, unable to get accurate O2 sats on her fingers given her poor circulation.    Stairs            Wheelchair Mobility    Modified Rankin (Stroke Patients Only)       Balance Overall balance assessment: Needs assistance Sitting-balance support: Feet unsupported Sitting balance-Leahy Scale: Good Sitting balance - Comments: Patient able to sit independently in the chair in the room.    Standing balance support: Bilateral upper extremity supported Standing balance-Leahy Scale: Fair Standing balance comment: Patient has RW too far in front of her and maintains a flexed trunk. She is very hard of hearing and is unable to change her gait pattern.                     Cognition Arousal/Alertness: Awake/alert Behavior During Therapy: WFL for tasks assessed/performed Overall Cognitive Status: Within Functional Limits for tasks assessed                      Exercises General Exercises - Lower Extremity Ankle Circles/Pumps: AROM;Both;Seated;15 reps Long Arc Quad: AROM;Both;Seated;10 reps Heel Slides: AAROM;Both;Seated;10 reps Straight Leg Raises: AAROM;Both;Seated;10 reps Hip Flexion/Marching: AROM;Both;Seated;10 reps    General Comments General comments (skin integrity, edema, etc.): Darkening of the skin in the extremities (hands, feet).       Pertinent Vitals/Pain Pain Assessment: No/denies pain    Home Living  Prior Function            PT Goals (current goals can now be found in the care plan section) Acute Rehab PT Goals Time For Goal Achievement: 01/25/15 Potential to Achieve Goals: Good Progress towards PT goals: Progressing toward goals    Frequency  Min 2X/week    PT Plan Current plan remains appropriate    Co-evaluation             End of Session Equipment Utilized During Treatment: Gait belt Activity Tolerance: Patient limited by fatigue;Patient tolerated  treatment well Patient left: in chair;with call bell/phone within reach;with family/visitor present (Chair alarm was not on when PT entered the room. PT notified RN of possible leak in her O2 from the wall who would be going in to check on her. )     Time: 0981-1914 PT Time Calculation (min) (ACUTE ONLY): 18 min  Charges:  $Gait Training: 8-22 mins                    G Codes:      Kerin Ransom, PT, DPT   01/15/2015, 3:34 PM

## 2015-01-15 NOTE — Progress Notes (Signed)
Nutrition Follow-up    INTERVENTION:   (Nutrition Supplement Therapy: )Family requesting ensure BID at this time.  Wants to continue magic cup BID as well Meals and snacks: Cater to pt prefences   NUTRITION DIAGNOSIS:  Inadequate oral intake related to  (decreased appetite) as evidenced by per patient/family report, improving.    GOAL:  Patient will meet greater than or equal to 90% of their needs  Pt currently meeting nutritional goals with current intake of meals and supplements    MONITOR:   (Energy Intake, Electrolyte and renal Profile, Anthropometrics)  REASON FOR ASSESSMENT:   (RD Screen, Diet Order, Diagnosis)    ASSESSMENT:  Current nutrition: Son at bedside and reports pt eating about 50-100% of meals. Likes ensure and magic cup. Son requesting more ensure for pt at this time  Labs: Electrolyte and Renal Profile:  Recent Labs Lab 01/12/15 0502 01/13/15 0445 01/14/15 0421 01/15/15 0451  BUN 90* 80* 76* 74*  CREATININE 2.10* 1.95* 1.66* 1.56*  NA 137 139 139 135  K 3.4* 4.3 3.6 3.7  MG 1.8  --   --   --    Medications: reviewed  Height:  Ht Readings from Last 1 Encounters:  01/13/15 5\' 4"  (1.626 m)    Weight:  Wt Readings from Last 1 Encounters:  01/14/15 118 lb 1.6 oz (53.57 kg)       Wt Readings from Last 10 Encounters:  01/14/15 118 lb 1.6 oz (53.57 kg)  01/03/15 113 lb 8 oz (51.483 kg)    BMI:  Body mass index is 20.26 kg/(m^2).  Estimated Nutritional Needs:  Kcal:  1250-1477kcals, BEE: 947kcals, TEE: (IF 1.1-1.3)(AF 1.2)   Protein:  58-68g protein (1.0-1.2g/kg)  Fluid:  1318-1537mL of fluid (25-33mL/kg)     Diet Order:  Diet renal with fluid restriction Fluid restriction:: 1200 mL Fluid; Room service appropriate?: Yes; Fluid consistency:: Thin  EDUCATION NEEDS:  No education needs identified at this time   Intake/Output Summary (Last 24 hours) at 01/15/15 1336 Last data filed at 01/15/15 1200  Gross per 24 hour   Intake   1686 ml  Output   1000 ml  Net    686 ml    Last BM: 6/21  LOW Care Level Alicia Trujillo B. Freida Busman, RD, LDN [phone] (pager)

## 2015-01-15 NOTE — Care Management (Signed)
Patient remains on 3 liters O2 discussed with RN yesterday regarding O2 sats assessment/documentation. Patient has been arranged with [facility] in case she needs home O2. PT and RN has also been arranged with [facility]- orders needed along with Medical Necessity or Face-to-Face. Flutter valve requested from MD and order was received.

## 2015-01-15 NOTE — Progress Notes (Signed)
ANTICOAGULATION CONSULT NOTE   Pharmacy Consult for warfarin dosing Indication: atrial fibrillation  Allergies  Allergen Reactions  . Latex Rash  . Levofloxacin Rash  . Sulfa Antibiotics Rash    Patient Measurements: Height: 5\' 4"  (162.6 cm) Weight: 118 lb 1.6 oz (53.57 kg) IBW/kg (Calculated) : 54.7 Heparin Dosing Weight: n/a  Vital Signs: BP: 96/64 mmHg (06/24 0054) Pulse Rate: 72 (06/24 0054)  Labs:  Recent Labs  01/13/15 0445 01/14/15 0421 01/15/15 0451  HGB 8.1* 7.9* 8.9*  HCT 25.6* 24.6* 27.4*  PLT 62* 60* 56*  LABPROT 31.7* 27.2* 21.6*  INR 3.06 2.51 1.86  CREATININE 1.95* 1.66* 1.56*    Estimated Creatinine Clearance: 21.5 mL/min (by C-G formula based on Cr of 1.56).   Medical History: Past Medical History  Diagnosis Date  . Asthma   . COPD (chronic obstructive pulmonary disease)   . A-fib   . Gout   . Essential hypertension   . Cardiomyopathy   . Chronic kidney disease   . Chronic systolic congestive heart failure   . Severe mitral regurgitation   . Mild aortic regurgitation   . Severe tricuspid regurgitation     Home med:  Coumadin 2mg  daily.  Assessment: 6/19 INR = 3.34, no coumadin given. 6/20 INR = 3.14, no coumadin given. 6/21 INR = 3.15, no coumadin given. 6/22 INR = 3.05, no coumadin given. 6/23 INR = 2.51, Coumadin 1mg  given. 6/24 INR = 1.86  Goal of Therapy:  INR 2-3   Plan:    Will increase Coumadin dose to 2mg  daily.  Will check INR in the morning and reassess therapy.   Stormy Card, [place]   Clinical Pharmacist 01/15/2015

## 2015-01-16 LAB — CBC WITH DIFFERENTIAL/PLATELET
Band Neutrophils: 4 % (ref 0–10)
Basophils Absolute: 0.1 10*3/uL (ref 0.0–0.1)
Basophils Relative: 1 % (ref 0–1)
Blasts: 0 %
Eosinophils Absolute: 0.1 10*3/uL (ref 0.0–0.7)
Eosinophils Relative: 1 % (ref 0–5)
HCT: 26.8 % — ABNORMAL LOW (ref 35.0–47.0)
Hemoglobin: 8.7 g/dL — ABNORMAL LOW (ref 12.0–16.0)
Lymphocytes Relative: 19 % (ref 12–46)
Lymphs Abs: 1 10*3/uL (ref 0.7–4.0)
MCH: 31.4 pg (ref 26.0–34.0)
MCHC: 32.6 g/dL (ref 32.0–36.0)
MCV: 96.3 fL (ref 80.0–100.0)
Metamyelocytes Relative: 0 %
Monocytes Absolute: 0.4 10*3/uL (ref 0.1–1.0)
Monocytes Relative: 8 % (ref 3–12)
Myelocytes: 1 %
Neutro Abs: 3.8 10*3/uL (ref 1.7–7.7)
Neutrophils Relative %: 66 % (ref 43–77)
Other: 0 %
Platelets: 60 10*3/uL — ABNORMAL LOW (ref 150–440)
Promyelocytes Absolute: 0 %
RBC: 2.78 MIL/uL — ABNORMAL LOW (ref 3.80–5.20)
RDW: 18.6 % — ABNORMAL HIGH (ref 11.5–14.5)
WBC: 5.4 10*3/uL (ref 3.6–11.0)
nRBC: 5 /100 WBC — ABNORMAL HIGH

## 2015-01-16 LAB — PROTIME-INR
INR: 1.89
Prothrombin Time: 21.9 seconds — ABNORMAL HIGH (ref 11.4–15.0)

## 2015-01-16 LAB — BASIC METABOLIC PANEL
Anion gap: 9 (ref 5–15)
BUN: 78 mg/dL — ABNORMAL HIGH (ref 6–20)
CO2: 29 mmol/L (ref 22–32)
Calcium: 8.3 mg/dL — ABNORMAL LOW (ref 8.9–10.3)
Chloride: 98 mmol/L — ABNORMAL LOW (ref 101–111)
Creatinine, Ser: 1.58 mg/dL — ABNORMAL HIGH (ref 0.44–1.00)
GFR calc Af Amer: 33 mL/min — ABNORMAL LOW (ref >60)
GFR calc non Af Amer: 28 mL/min — ABNORMAL LOW (ref >60)
Glucose, Bld: 90 mg/dL (ref 65–99)
Potassium: 4.2 mmol/L (ref 3.5–5.1)
Sodium: 136 mmol/L (ref 135–145)

## 2015-01-16 LAB — IRON AND TIBC
Iron: 27 ug/dL — ABNORMAL LOW (ref 28–170)
Saturation Ratios: 7 % — ABNORMAL LOW (ref 10.4–31.8)
TIBC: 379 ug/dL (ref 250–450)
UIBC: 352 ug/dL

## 2015-01-16 LAB — FERRITIN: FERRITIN: 103 ng/mL (ref 11–307)

## 2015-01-16 MED ORDER — SODIUM CHLORIDE 0.9 % IV BOLUS (SEPSIS)
250.0000 mL | Freq: Once | INTRAVENOUS | Status: AC
  Administered 2015-01-16: 250 mL via INTRAVENOUS

## 2015-01-16 MED ORDER — SODIUM CHLORIDE 0.9 % IV SOLN
INTRAVENOUS | Status: AC
  Administered 2015-01-16: 13:00:00 via INTRAVENOUS

## 2015-01-16 MED ORDER — ALUM & MAG HYDROXIDE-SIMETH 200-200-20 MG/5ML PO SUSP: 30.0000 mL | Freq: Four times a day (QID) | ORAL | Status: DC | PRN

## 2015-01-16 NOTE — Progress Notes (Signed)
Pt. BP in the low 80/50, Dr. Anne Hahn notified and 250 mls normal saline bolus admin at 01:39, BP rechecked at 04:15 and was 97/56, Dr. Sheryle Hail notified and another 250 mls normal saline ordered.

## 2015-01-16 NOTE — Progress Notes (Signed)
ANTICOAGULATION CONSULT NOTE   Pharmacy Consult for warfarin dosing Indication: atrial fibrillation  Allergies  Allergen Reactions  . Latex Rash  . Levofloxacin Rash  . Sulfa Antibiotics Rash    Patient Measurements: Height: 5\' 4"  (162.6 cm) Weight: 118 lb 1.6 oz (53.57 kg) IBW/kg (Calculated) : 54.7 Heparin Dosing Weight: n/a  Vital Signs: BP: 97/56 mmHg (06/25 0415) Pulse Rate: 69 (06/25 0054)  Labs:  Recent Labs  01/14/15 0421 01/15/15 0451 01/16/15 0437  HGB 7.9* 8.9*  --   HCT 24.6* 27.4*  --   PLT 60* 56*  --   LABPROT 27.2* 21.6* 21.9*  INR 2.51 1.86 1.89  CREATININE 1.66* 1.56* 1.58*    Estimated Creatinine Clearance: 21.2 mL/min (by C-G formula based on Cr of 1.58).   Medical History: Past Medical History  Diagnosis Date  . Asthma   . COPD (chronic obstructive pulmonary disease)   . A-fib   . Gout   . Essential hypertension   . Cardiomyopathy   . Chronic kidney disease   . Chronic systolic congestive heart failure   . Severe mitral regurgitation   . Mild aortic regurgitation   . Severe tricuspid regurgitation     Home med:  Coumadin 2mg  daily.  Assessment: 6/19 INR = 3.34, no coumadin given. 6/20 INR = 3.14, no coumadin given. 6/21 INR = 3.15, no coumadin given. 6/22 INR = 3.05, no coumadin given. 6/23 INR = 2.51, Coumadin 1mg  given. 6/24 INR = 1.86, Coumadin 2mg  given. 6/25 INR = 1.89  Goal of Therapy:  INR 2-3   Plan:    Will continue Coumadin 2mg  daily.   Will check INR in the morning and reassess therapy.   Stormy Card, [place]   Clinical Pharmacist 01/16/2015

## 2015-01-16 NOTE — Consult Note (Signed)
[facility] CONSULT NOTE    Date: 01/16/2015                  Patient Name:  Alicia Trujillo  MRN: [id number]  DOB: 05-06-1925  Age / Sex: 81 y.o., female         PCP: Dion Body, MD                 Service Requesting Consult: Dr. Bridgett Larsson                 Reason for Consult: Acute renal failure            History of Present Illness: Patient is a 81 y.o. female with a PMHx of asthma, COPD, atrial fibrillation, gout, hypertension, chronic kidney disease stage III, chronic systolic heart failure, mitral regurgitation, who was admitted to [facility] on 01/10/2015 for evaluation of left lower extremity wound. We are asked to see the patient for evaluation management of acute renal failure. Upon presentation Alicia Trujillo was quite high at 103 with a creatinine of 2.62. Her baseline creatinine appears to be 1.05 from 10/14/2013. Therefore it appears that she does have underlying chronic kidney disease stage III. Patient has been provided with IV fluid hydration and creatinine was trending down. However patient appears to have developed worsening shortness of breath yesterday. Therefore IV fluids were stopped. Chest x-ray was performed on 01/13/2015 which revealed bilateral pleural effusions at that time with cardiac enlargement. The patient has been started back on IV fluid hydration today.   Medications: Outpatient medications: Prescriptions prior to admission  Medication Sig Dispense Refill Last Dose  . albuterol (PROAIR HFA) 108 (90 BASE) MCG/ACT inhaler Inhale 2 puffs into the lungs every 4 (four) hours as needed.   unknown  . allopurinol (ZYLOPRIM) 100 MG tablet Take 100 mg by mouth daily.  0 unknown  . calcium-vitamin D (CALCIUM 500/D) 500-200 MG-UNIT per tablet Take 1 tablet by mouth daily.   unknown  . fexofenadine (ALLEGRA) 180 MG tablet Take 180 mg by mouth daily.   unknown  . Fluticasone-Salmeterol (ADVAIR DISKUS) 250-50 MCG/DOSE AEPB Inhale 1 puff into the lungs  every 12 (twelve) hours.   unknown  . furosemide (LASIX) 40 MG tablet Take 40 mg by mouth 2 (two) times daily as needed.   unknown  . metolazone (ZAROXOLYN) 2.5 MG tablet Take 2.5 mg by mouth daily. Stop if BP drops below 90/60.   unknown  . metoprolol tartrate (LOPRESSOR) 25 MG tablet Take 12.5 mg by mouth 2 (two) times daily.   unknown  . montelukast (SINGULAIR) 10 MG tablet Take 10 mg by mouth daily.   unknown  . Multiple Vitamin (MULTI-VITAMINS) TABS Take 1 tablet by mouth daily.   unknown  . potassium chloride (K-DUR) 10 MEQ tablet Take 2 tablets (20 mEq total) by mouth 2 (two) times daily. 6 tablet 0   . spironolactone (ALDACTONE) 25 MG tablet Take 25 mg by mouth daily.   unknown  . warfarin (COUMADIN) 2 MG tablet Take 2 mg by mouth daily.   unknown    Current medications: Current Facility-Administered Medications  Medication Dose Route Frequency Provider Last Rate Last Dose  . 0.9 %  sodium chloride infusion   Intravenous Continuous Demetrios Loll, MD 50 mL/hr at 01/16/15 1319    . acetaminophen (TYLENOL) tablet 650 mg  650 mg Oral Q6H PRN Lance Coon, MD   650 mg at 01/14/15 2117   Or  . acetaminophen (TYLENOL) suppository 650  mg  650 mg Rectal Q6H PRN Lance Coon, MD      . albuterol (PROVENTIL) (2.5 MG/3ML) 0.083% nebulizer solution 3 mL  3 mL Inhalation Q4H PRN Lance Coon, MD      . allopurinol (ZYLOPRIM) tablet 100 mg  100 mg Oral Daily Lance Coon, MD   100 mg at 01/15/15 1114  . alum & mag hydroxide-simeth (MAALOX/MYLANTA) 200-200-20 MG/5ML suspension 30 mL  30 mL Oral Q6H PRN Demetrios Loll, MD      . cephALEXin [facility]) capsule 500 mg  500 mg Oral 3 times per day Adrian Prows, MD   500 mg at 01/16/15 0532  . collagenase (SANTYL) ointment   Topical Daily Demetrios Loll, MD      . doxycycline (VIBRA-TABS) tablet 100 mg  100 mg Oral Q12H Adrian Prows, MD   100 mg at 01/16/15 1033  . feeding supplement (ENSURE ENLIVE) (ENSURE ENLIVE) liquid 237 mL  237 mL Oral BID BM Demetrios Loll, MD    237 mL at 01/15/15 1527  . HYDROcodone-acetaminophen (NORCO/VICODIN) 5-325 MG per tablet 1-2 tablet  1-2 tablet Oral Q4H PRN Lance Coon, MD      . metoprolol tartrate (LOPRESSOR) tablet 12.5 mg  12.5 mg Oral BID Demetrios Loll, MD   12.5 mg at 01/15/15 2240  . mometasone-formoterol (DULERA) 100-5 MCG/ACT inhaler 2 puff  2 puff Inhalation BID Lance Coon, MD   2 puff at 01/15/15 2010  . montelukast (SINGULAIR) tablet 10 mg  10 mg Oral Daily Lance Coon, MD   10 mg at 01/16/15 1034  . ondansetron (ZOFRAN) injection 4 mg  4 mg Intravenous Q6H PRN Demetrios Loll, MD   4 mg at 01/15/15 1802  . senna-docusate (Senokot-S) tablet 1 tablet  1 tablet Oral QHS PRN Lance Coon, MD      . sodium chloride 0.9 % injection 3 mL  3 mL Intravenous Q12H Lance Coon, MD   3 mL at 01/16/15 1039  . warfarin (COUMADIN) tablet 2 mg  2 mg Oral q1800 Lance Coon, MD   2 mg at 01/15/15 1802      Allergies: Allergies  Allergen Reactions  . Latex Rash  . Levofloxacin Rash  . Sulfa Antibiotics Rash      Past Medical History: Past Medical History  Diagnosis Date  . Asthma   . COPD (chronic obstructive pulmonary disease)   . A-fib   . Gout   . Essential hypertension   . Cardiomyopathy   . Chronic kidney disease   . Chronic systolic congestive heart failure   . Severe mitral regurgitation   . Mild aortic regurgitation   . Severe tricuspid regurgitation      Past Surgical History: Past Surgical History  Procedure Laterality Date  . Abdominal hysterectomy    . Bladder surgery       Family History: Family History  Problem Relation Age of Onset  . Asthma Father   . Lung cancer Brother      Social History: History   Social History  . Marital Status: Widowed    Spouse Name: N/A  . Number of Children: N/A  . Years of Education: N/A   Occupational History  . Not on file.   Social History Main Topics  . Smoking status: Former Research scientist (life sciences)  . Smokeless tobacco: Never Used  . Alcohol Use: No  . Drug  Use: No  . Sexual Activity: No   Other Topics Concern  . Not on file   Social History Narrative  Lives  with son.   Review of Systems: Patient unable to offer accurate review of systems as shes very hard of hearing and had difficulty understanding and responding to questions.  Vital Signs: Blood pressure 76/48, pulse 98, temperature 97.5 F (36.4 C), temperature source Oral, resp. rate 18, height _0  (1.626 m), weight 57.788 kg (127 lb 6.4 oz), SpO2 69 %.  Weight trends: Filed Weights   01/13/15 0500 01/14/15 0120 01/16/15 0700  Weight: 54.341 kg (119 lb 12.8 oz) 53.57 kg (118 lb 1.6 oz) 57.788 kg (127 lb 6.4 oz)    Physical Exam: General: Frail appearing  Head: Normocephalic, atraumatic. Very heard of hearing.  Eyes: Anicteric, EOMI  Nose: Mucous membranes moist, not inflammed, nonerythematous.  Throat: Oropharynx nonerythematous, no exudate appreciated.   Neck: Supple, trachea midline.  Lungs:  Normal respiratory effort. Bilateral rhonchi  Heart: S1S2 no rubs 2/6 SEM irregular  Abdomen:  BS normoactive. Soft, Nondistended, non-tender.  No masses or organomegaly.  Extremities: LLE wrapped, 1+ edema in RLE  Neurologic: Awake, alert, follows simple commands, very hard of hearing  Skin: Dependent Rubor noted in RLE    Lab results: Basic Metabolic Panel:  Recent Labs Lab 01/12/15 0502  01/14/15 0421 01/15/15 0451 01/16/15 0437  NA 137  < > 139 135 136  K 3.4*  < > 3.6 3.7 4.2  CL 97*  < > 96* 96* 98*  CO2 31  < > _1 GLUCOSE 93  < > 105* 104* 90  BUN 90*  < > 76* 74* 78*  CREATININE 2.10*  < > 1.66* 1.56* 1.58*  CALCIUM 8.1*  < > 8.1* 7.9* 8.3*  MG 1.8  --   --   --   --   < > = values in this interval not displayed.  Liver Function Tests:  Recent Labs Lab 01/10/15 1947  AST 134*  ALT 84*  ALKPHOS 119  BILITOT 1.5*  PROT 6.8  ALBUMIN 3.6   No results for input(s): LIPASE, AMYLASE in the last 168 hours. No results for input(s): AMMONIA in the  last 168 hours.  CBC:  Recent Labs Lab 01/12/15 0502 01/13/15 0445 01/14/15 0421 01/15/15 0451 01/16/15 0437  WBC 5.4 4.3 4.5 5.2 5.4  NEUTROABS  --  3.2 3.7 3.6 3.8  HGB 8.2* 8.1* 7.9* 8.9* 8.7*  HCT 25.4* 25.6* 24.6* 27.4* 26.8*  MCV 96.4 96.9 96.4 96.7 96.3  PLT 70* 62* 60* 56* 60*    Cardiac Enzymes: No results for input(s): CKTOTAL, CKMB, CKMBINDEX, TROPONINI in the last 168 hours.  BNP: Invalid input(s): POCBNP  CBG: No results for input(s): GLUCAP in the last 168 hours.  Microbiology: Results for orders placed or performed during the hospital encounter of 01/10/15  Wound culture     Status: None   Collection Time: 01/10/15  8:36 PM  Result Value Ref Range Status   Specimen Description LEG LEFT LEG  Final   Special Requests Normal  Final   Gram Stain   Final    RARE WBC SEEN RARE GRAM POSITIVE COCCI RARE GRAM NEGATIVE RODS    Culture   Final    MODERATE GROWTH PROTEUS MIRABILIS LIGHT GROWTH ESCHERICHIA COLI LIGHT GROWTH METHICILLIN RESISTANT STAPHYLOCOCCUS AUREUS LIGHT GROWTH GROUP B STREP(S.AGALACTIAE)ISOLATED Virtually 100% of S. agalactiae (Group B) strains are susceptible to Penicillin.  For Penicillin-allergic patients, Erythromycin (85-95% sensitive) and Clindamycin (80% sensitive) are drugs of choice. Contact microbiology lab to request sensitivities if  needed within 7 days.  Report Status 01/14/2015 FINAL  Final   Organism ID, Bacteria PROTEUS MIRABILIS  Final   Organism ID, Bacteria ESCHERICHIA COLI  Final   Organism ID, Bacteria METHICILLIN RESISTANT STAPHYLOCOCCUS AUREUS  Final      Susceptibility   Escherichia coli - MIC*    AMPICILLIN >=32 RESISTANT Resistant     CEFAZOLIN <=4 SENSITIVE Sensitive     CEFTRIAXONE <=1 SENSITIVE Sensitive     CIPROFLOXACIN >=4 RESISTANT Resistant     GENTAMICIN >=16 RESISTANT Resistant     IMIPENEM <=0.25 SENSITIVE Sensitive     TRIMETH/SULFA >=320 RESISTANT Resistant     CEFOXITIN <=4 SENSITIVE  Sensitive     PIP/TAZO Value in next row Sensitive      SENSITIVE<=4    * LIGHT GROWTH ESCHERICHIA COLI   Proteus mirabilis - MIC*    AMPICILLIN Value in next row Sensitive      SENSITIVE<=4    CEFAZOLIN Value in next row Sensitive      SENSITIVE<=4    CEFTRIAXONE Value in next row Sensitive      SENSITIVE<=4    CIPROFLOXACIN Value in next row Sensitive      SENSITIVE<=4    GENTAMICIN Value in next row Sensitive      SENSITIVE<=4    IMIPENEM Value in next row Sensitive      SENSITIVE<=4    TRIMETH/SULFA Value in next row Sensitive      SENSITIVE<=4    CEFOXITIN Value in next row Sensitive      SENSITIVE<=4    PIP/TAZO Value in next row Sensitive      SENSITIVE<=4    * MODERATE GROWTH PROTEUS MIRABILIS   Methicillin resistant staphylococcus aureus - MIC*    CIPROFLOXACIN Value in next row Resistant      SENSITIVE<=4    GENTAMICIN Value in next row Sensitive      SENSITIVE<=4    OXACILLIN Value in next row Resistant      SENSITIVE<=4    VANCOMYCIN Value in next row Sensitive      SENSITIVE<=4    TRIMETH/SULFA Value in next row Sensitive      SENSITIVE<=4    CEFOXITIN SCREEN Value in next row Resistant      POSITIVECEFOXITIN SCREEN - This test may be used to predict mecA-mediated oxacillin resistance, and it is based on the cefoxitin disk screen test.  The cefoxitin screen and oxacillin work in combination to determine the final interpretation reported for oxacillin.     Inducible Clindamycin Value in next row Resistant      POSITIVEINDUCIBLE CLINDAMYCIN RESISTANCE - A positive ICR test is indicative of inducible resistance to macrolides, lincosamides, and type B streptogramin.  This isolate is presumed to be resistant to Clindamycin, however, Clindamycin may still be effective in some patients.     TETRACYCLINE Value in next row Sensitive      SENSITIVE<=1    CLINDAMYCIN Value in next row Resistant      RESISTANT>=8    LEVOFLOXACIN Value in next row Resistant       RESISTANT>=8    * LIGHT GROWTH METHICILLIN RESISTANT STAPHYLOCOCCUS AUREUS    Coagulation Studies:  Recent Labs  01/14/15 0421 01/15/15 0451 01/16/15 0437  LABPROT 27.2* 21.6* 21.9*  INR 2.51 1.86 1.89    Urinalysis: No results for input(s): COLORURINE, LABSPEC, PHURINE, GLUCOSEU, HGBUR, BILIRUBINUR, KETONESUR, PROTEINUR, UROBILINOGEN, NITRITE, LEUKOCYTESUR in the last 72 hours.  Invalid input(s): APPERANCEUR    Imaging:  No results found.   Assessment & Plan:  81 y.o. female with a PMHx of asthma, COPD, atrial fibrillation, gout, hypertension, chronic kidney disease stage III, chronic systolic heart failure, mitral regurgitation, who was admitted to [facility] on 01/10/2015 for evaluation of left lower extremity wound.   1.  Acute renal failure due to ATN/CKD stage III baseline egfr 48.  The patient was on multiple diuretic's including Lasix and spironolactone upon admission. These have been held. She was receiving IV fluid hydration and had improvement in renal function but IV fluids were held yesterday given worsening shortness of breath. They have now been restarted. Renal ultrasound was performed earlier in the hospitalization and demonstrated atrophic kidneys. Continue to monitor renal function trend for now.  2. Anemia of chronic kidney disease. Hemoglobin noted as being 8.7. Platelets also low at 60,000. We will check SPEP and UPEP for further evaluation.

## 2015-01-16 NOTE — Progress Notes (Signed)
[facility] - [place] at [facility]   PATIENT NAME: Alicia Trujillo    MR#:  [id number]  DATE OF BIRTH:  1924-09-03  SUBJECTIVE:  CHIEF COMPLAINT:   Chief Complaint  Patient presents with  . Wound Check  disgestion. On oxygen 3 L by nasal cannular.  REVIEW OF SYSTEMS:  CONSTITUTIONAL: No fever, generalized weakness.  EYES: No blurred or double vision.  EARS, NOSE, AND THROAT: No tinnitus or ear pain. Hard hearing. RESPIRATORY: has cough, but no shortness of breath, wheezing or hemoptysis.  CARDIOVASCULAR: No chest pain, orthopnea, but has leg edema.  GASTROINTESTINAL: No nausea, vomiting, diarrhea or abdominal pain.  GENITOURINARY: No dysuria, hematuria.  ENDOCRINE: No polyuria, nocturia,  HEMATOLOGY: No anemia, easy bruising or bleeding SKIN: No rash or lesion.  MUSCULOSKELETAL: No joint pain or arthritis.   NEUROLOGIC: No tingling, numbness, weakness.  PSYCHIATRY: No anxiety or depression.   DRUG ALLERGIES:   Allergies  Allergen Reactions  . Latex Rash  . Levofloxacin Rash  . Sulfa Antibiotics Rash    VITALS:  Blood pressure 93/56, pulse 69, temperature 98.1 F (36.7 C), temperature source Oral, resp. rate 16, height 5\' 4"  (1.626 m), weight 57.788 kg (127 lb 6.4 oz), SpO2 92 %.  PHYSICAL EXAMINATION:  GENERAL:  81 y.o.-year-old patient lying in the bed with no acute distress.  EYES: Pupils equal, round, reactive to light and accommodation. No scleral icterus. Extraocular muscles intact.  HEENT: Head atraumatic, normocephalic. Hard hearing. Oropharynx and nasopharynx clear. moist oral mucosa. NECK:  Supple, no jugular venous distention. No thyroid enlargement, no tenderness.  LUNGS: Normal breath sounds bilaterally, no wheezing, rales,rhonchi or crepitation. No use of accessory muscles of respiration.  CARDIOVASCULAR: S1, S2 normal. Systolic murmurs 2/6, no rubs, or gallops.  ABDOMEN: Soft, nontender, nondistended. Bowel sounds present. No  organomegaly or mass.  EXTREMITIES: right leg in dressing, better left leg edema with erythema,  No cyanosis, or clubbing.  NEUROLOGIC: Cranial nerves II through XII are intact. Muscle strength 4/5 in all extremities. Sensation intact. Gait not checked.  PSYCHIATRIC: The patient is alert and oriented x 3.  SKIN: No obvious rash, lesion, or ulcer.   LABORATORY PANEL:   CBC  Recent Labs Lab 01/16/15 0437  WBC 5.4  HGB 8.7*  HCT 26.8*  PLT 60*   ------------------------------------------------------------------------------------------------------------------  Chemistries   Recent Labs Lab 01/10/15 1947  01/12/15 0502  01/16/15 0437  NA 131*  < > 137  < > 136  K 6.2*  < > 3.4*  < > 4.2  CL 91*  < > 97*  < > 98*  CO2 30  < > 31  < > 29  GLUCOSE 104*  < > 93  < > 90  BUN 103*  < > 90*  < > 78*  CREATININE 2.62*  < > 2.10*  < > 1.58*  CALCIUM 8.6*  < > 8.1*  < > 8.3*  MG  --   --  1.8  --   --   AST 134*  --   --   --   --   ALT 84*  --   --   --   --   ALKPHOS 119  --   --   --   --   BILITOT 1.5*  --   --   --   --   < > = values in this interval not displayed. ------------------------------------------------------------------------------------------------------------------  Cardiac Enzymes No results for input(s): TROPONINI in the last 168  hours. ------------------------------------------------------------------------------------------------------------------  RADIOLOGY:  No results found.  EKG:   Orders placed or performed during the hospital encounter of 01/10/15  . EKG 12-Lead  . EKG 12-Lead    ASSESSMENT AND PLAN:   Acute on chronic kidney failure (currently stage 4).  improving.  NS was disontinued due to congestion. The patient had hypotension yesterday, given NS 250 ml bolus. A little worsening renal function. I will resume NS iv 50 ml/h, request nephrology consult and follow-up BMP. Hold Lasix.  Hypokalemia. Improved after supplement. Hyponatremia.  Improved  Wound of left lower extremity. Wound culture showed Escherichia coli and Proteus Mirabilis and MRSA,  Discontinued vancomycin and Rocephin. cont Keflex and doxycycline for 6 more days per Dr. Sampson Goon. continue wound care.   Elevated INR - therapeutic, resumed warfarin dose per pharmacy, follow-up INR. Atrial fibrillation - anticoagulation as above, continue Lopressor.  Chronic systolic congestive heart failure, stable. hold diuretics.   Essential hypertension -controlled. continue Lopressor. COPD (chronic obstructive pulmonary disease) - stable. continue home inhalers  Hypoxia. Continue On oxygen 3 L and nebulizer treatment. Anemia of chronic disease. Stable. Chronic thrombocytopenia. Stable  Continue PT.  The patient needs home health and PT.  All the records are reviewed and case discussed with Care Management/Social Workerr. Management plans discussed with the patient, the patient's son and other family member, and they are in agreement. Discussed with Dr. Cherylann Ratel.  CODE STATUS: Full code.  TOTAL TIME TAKING CARE OF THIS PATIENT: 42   minutes.   POSSIBLE D/C IN 3-4 DAYS, DEPENDING ON CLINICAL CONDITION.   Shaune Pollack M.D on 01/16/2015 at 12:55 PM  Between 7am to 6pm - Pager - [phone]  After 6pm go to [email or web address] - password [email or web address] [facility]  [facility]  Office  [phone]  CC: Primary care physician; Marisue Ivan, MD

## 2015-01-17 LAB — BASIC METABOLIC PANEL
ANION GAP: 12 (ref 5–15)
BUN: 93 mg/dL — AB (ref 6–20)
CALCIUM: 8.2 mg/dL — AB (ref 8.9–10.3)
CO2: 26 mmol/L (ref 22–32)
CREATININE: 2.17 mg/dL — AB (ref 0.44–1.00)
Chloride: 95 mmol/L — ABNORMAL LOW (ref 101–111)
GFR calc Af Amer: 22 mL/min — ABNORMAL LOW (ref >60)
GFR, EST NON AFRICAN AMERICAN: 19 mL/min — AB (ref >60)
GLUCOSE: 84 mg/dL (ref 65–99)
POTASSIUM: 4.3 mmol/L (ref 3.5–5.1)
SODIUM: 133 mmol/L — AB (ref 135–145)

## 2015-01-17 LAB — PROTIME-INR
INR: 1.98
PROTHROMBIN TIME: 22.7 s — AB (ref 11.4–15.0)

## 2015-01-17 LAB — KAPPA/LAMBDA LIGHT CHAINS
Kappa free light chain: 103.05 mg/L — ABNORMAL HIGH (ref 3.30–19.40)
Kappa, lambda light chain ratio: 1.15 (ref 0.26–1.65)
LAMDA FREE LIGHT CHAINS: 89.36 mg/L — AB (ref 5.71–26.30)

## 2015-01-17 LAB — CBC WITH DIFFERENTIAL/PLATELET
BASOS ABS: 0 10*3/uL (ref 0.0–0.1)
Band Neutrophils: 4 % (ref 0–10)
Basophils Relative: 0 % (ref 0–1)
Blasts: 0 %
Eosinophils Absolute: 0 10*3/uL (ref 0.0–0.7)
Eosinophils Relative: 0 % (ref 0–5)
HCT: 27 % — ABNORMAL LOW (ref 35.0–47.0)
Hemoglobin: 8.6 g/dL — ABNORMAL LOW (ref 12.0–16.0)
Lymphocytes Relative: 10 % — ABNORMAL LOW (ref 12–46)
Lymphs Abs: 0.6 10*3/uL — ABNORMAL LOW (ref 0.7–4.0)
MCH: 31.1 pg (ref 26.0–34.0)
MCHC: 32 g/dL (ref 32.0–36.0)
MCV: 97.3 fL (ref 80.0–100.0)
Metamyelocytes Relative: 1 %
Monocytes Absolute: 0.4 10*3/uL (ref 0.1–1.0)
Monocytes Relative: 6 % (ref 3–12)
Myelocytes: 2 %
NEUTROS ABS: 5.2 10*3/uL (ref 1.7–7.7)
NRBC: 3 /100{WBCs} — AB
Neutrophils Relative %: 77 % (ref 43–77)
OTHER: 0 %
Platelets: 66 10*3/uL — ABNORMAL LOW (ref 150–440)
Promyelocytes Absolute: 0 %
RBC: 2.77 MIL/uL — ABNORMAL LOW (ref 3.80–5.20)
RDW: 18.8 % — ABNORMAL HIGH (ref 11.5–14.5)
WBC: 6.2 10*3/uL (ref 3.6–11.0)

## 2015-01-17 LAB — C DIFFICILE QUICK SCREEN W PCR REFLEX
C DIFFICLE (CDIFF) ANTIGEN: NEGATIVE
C Diff interpretation: NEGATIVE
C Diff toxin: NEGATIVE

## 2015-01-17 MED ORDER — FERROUS SULFATE 325 (65 FE) MG PO TABS
325.0000 mg | ORAL_TABLET | Freq: Two times a day (BID) | ORAL | Status: DC
Start: 2015-01-17 — End: 2015-01-19
  Administered 2015-01-17 – 2015-01-19 (×4): 325 mg via ORAL
  Filled 2015-01-17 (×4): qty 1

## 2015-01-17 NOTE — Progress Notes (Signed)
[facility] - [place] at [facility]   PATIENT NAME: Alicia Trujillo    MR#:  [id number]  DATE OF BIRTH:  21-May-1925  SUBJECTIVE:  CHIEF COMPLAINT:   Chief Complaint  Patient presents with  . Wound Check  No complaint.  On oxygen 3 L by nasal cannular.  REVIEW OF SYSTEMS:  CONSTITUTIONAL: No fever, generalized weakness.  EYES: No blurred or double vision.  EARS, NOSE, AND THROAT: No tinnitus or ear pain. Hard hearing. RESPIRATORY: has cough, but no shortness of breath, wheezing or hemoptysis.  CARDIOVASCULAR: No chest pain, orthopnea, but has leg edema.  GASTROINTESTINAL: No nausea, vomiting, diarrhea or abdominal pain.  GENITOURINARY: No dysuria, hematuria.  ENDOCRINE: No polyuria, nocturia,  HEMATOLOGY: No anemia, easy bruising or bleeding SKIN: No rash or lesion.  MUSCULOSKELETAL: No joint pain or arthritis.   NEUROLOGIC: No tingling, numbness, weakness.  PSYCHIATRY: No anxiety or depression.   DRUG ALLERGIES:   Allergies  Allergen Reactions  . Latex Rash  . Levofloxacin Rash  . Sulfa Antibiotics Rash    VITALS:  Blood pressure 94/62, pulse 75, temperature 97.7 F (36.5 C), temperature source Oral, resp. rate 16, height  (1.626 m), weight 57.017 kg (125 lb 11.2 oz), SpO2 72 %.  PHYSICAL EXAMINATION:  GENERAL:  81 y.o.-year-old patient lying in the bed with no acute distress.  EYES: Pupils equal, round, reactive to light and accommodation. No scleral icterus. Extraocular muscles intact.  HEENT: Head atraumatic, normocephalic. Hard hearing. Oropharynx and nasopharynx clear. moist oral mucosa. NECK:  Supple, no jugular venous distention. No thyroid enlargement, no tenderness.  LUNGS: Normal breath sounds bilaterally, no wheezing, rales,rhonchi or crepitation. No use of accessory muscles of respiration.  CARDIOVASCULAR: S1, S2 normal. Systolic murmurs 2/6, no rubs, or gallops.  ABDOMEN: Soft, nontender, nondistended. Bowel sounds present. No  organomegaly or mass.  EXTREMITIES: Left leg in dressing, right leg edema without erythema,  No cyanosis, or clubbing.  NEUROLOGIC: Cranial nerves II through XII are intact. Muscle strength 4/5 in all extremities. Sensation intact. Gait not checked.  PSYCHIATRIC: The patient is alert and oriented x 3.  SKIN: No obvious rash, lesion, or ulcer.   LABORATORY PANEL:   CBC  Recent Labs Lab 01/17/15 0536  WBC 6.2  HGB 8.6*  HCT 27.0*  PLT 66*   ------------------------------------------------------------------------------------------------------------------  Chemistries   Recent Labs Lab 01/10/15 1947  01/12/15 0502  01/17/15 0536  NA 131*  < > 137  < > 133*  K 6.2*  < > 3.4*  < > 4.3  CL 91*  < > 97*  < > 95*  CO2 30  < > 31  < > 26  GLUCOSE 104*  < > 93  < > 84  BUN 103*  < > 90*  < > 93*  CREATININE 2.62*  < > 2.10*  < > 2.17*  CALCIUM 8.6*  < > 8.1*  < > 8.2*  MG  --   --  1.8  --   --   AST 134*  --   --   --   --   ALT 84*  --   --   --   --   ALKPHOS 119  --   --   --   --   BILITOT 1.5*  --   --   --   --   < > = values in this interval not displayed. ------------------------------------------------------------------------------------------------------------------  Cardiac Enzymes No results for input(s): TROPONINI in the last  168 hours. ------------------------------------------------------------------------------------------------------------------  RADIOLOGY:  No results found.  EKG:   Orders placed or performed during the hospital encounter of 01/10/15  . EKG 12-Lead  . EKG 12-Lead    ASSESSMENT AND PLAN:   Acute on chronic kidney failure (currently stage 4). Worsening.  Continue NS iv 50 ml/h, follow-up Dr. Cherylann Ratel and BMP. Hold Lasix.  Hypokalemia. Improved after supplement. Hyponatremia. Improved  Wound of left lower extremity. Wound culture showed Escherichia coli and Proteus Mirabilis and MRSA,  Discontinued vancomycin and Rocephin. cont  Keflex and doxycycline for 5 more days. continue wound care.   Elevated INR - therapeutic, continue warfarin dose per pharmacy, follow-up INR. Atrial fibrillation - anticoagulation as above, continue Lopressor ( hold if SBP<100 or DBP<60).  Chronic systolic congestive heart failure, stable. hold diuretics.   Essential hypertension -controlled. continue Lopressor. COPD (chronic obstructive pulmonary disease) - stable. continue home inhalers  Hypoxia. Continue On oxygen 3 L and nebulizer treatment. Pulse oximetry is not correct due to poor circulation of hand.   Anemia of chronic disease. Stable. Chronic thrombocytopenia. Stable  Continue PT.  The patient needs home health and PT.  All the records are reviewed and case discussed with Care Management/Social Workerr. Management plans discussed with the patient, the patient's son and other family member, and they are in agreement. Discussed with Dr. Cherylann Ratel.  CODE STATUS: Full code.  TOTAL TIME TAKING CARE OF THIS PATIENT: 37   minutes.   POSSIBLE D/C IN 3-4 DAYS, DEPENDING ON CLINICAL CONDITION.   Shaune Pollack M.D on 01/17/2015 at 11:04 AM  Between 7am to 6pm - Pager - [phone]  After 6pm go to [email or web address] - password [email or web address] [facility]  [place] [facility]  Office  [phone]  CC: Primary care physician; Marisue Ivan, MD

## 2015-01-17 NOTE — Progress Notes (Signed)
[facility]  ROUNDING NOTE   Subjective:  Renal function worse this AM. Cr up to 2.17. UOP 650cc over the past 3 shifts.      Objective:  Vital signs in last 24 hours:  Temp:  [97.5 F (36.4 C)-97.7 F (36.5 C)] 97.7 F (36.5 C) (06/25 1658) Pulse Rate:  [75-132] 75 (06/26 0839) Resp:  [16-18] 16 (06/26 0003) BP: (76-98)/(48-72) 94/62 mmHg (06/26 0839) SpO2:  [69 %-100 %] 72 % (06/26 0839) Weight:  [57.017 kg (125 lb 11.2 oz)] 57.017 kg (125 lb 11.2 oz) (06/26 1655)  Weight change: -0.771 kg (-1 lb 11.2 oz) Filed Weights   01/14/15 0120 01/16/15 0700 01/17/15 0608  Weight: 53.57 kg (118 lb 1.6 oz) 57.788 kg (127 lb 6.4 oz) 57.017 kg (125 lb 11.2 oz)    Intake/Output: I/O last 3 completed shifts: In: 1097.7 [P.O.:120; I.V.:977.7] Out: 650 [Urine:650]   Intake/Output this shift:     Physical Exam: General: Frail appearing  Head: Normocephalic, atraumatic. Moist oral mucosal membranes  Eyes: Anicteric  Neck: Supple, trachea midline  Lungs:  Bilateral rhonchi, normal effort  Heart: S1S2, no rubs  Abdomen:  Soft, nontender, BS present   Extremities:  1+ RLE edema, left leg wrapped  Neurologic: Awake, follows commands, hard of hearing  Skin: No lesions       Basic Metabolic Panel:  Recent Labs Lab 01/12/15 0502 01/13/15 0445 01/14/15 0421 01/15/15 0451 01/16/15 0437 01/17/15 0536  NA 137 139 139 135 136 133*  K 3.4* 4.3 3.6 3.7 4.2 4.3  CL 97* 98* 96* 96* 98* 95*  CO2 31 33* 30 29 29 26   GLUCOSE 93 106* 105* 104* 90 84  BUN 90* 80* 76* 74* 78* 93*  CREATININE 2.10* 1.95* 1.66* 1.56* 1.58* 2.17*  CALCIUM 8.1* 7.9* 8.1* 7.9* 8.3* 8.2*  MG 1.8  --   --   --   --   --     Liver Function Tests:  Recent Labs Lab 01/10/15 1947  AST 134*  ALT 84*  ALKPHOS 119  BILITOT 1.5*  PROT 6.8  ALBUMIN 3.6   No results for input(s): LIPASE, AMYLASE in the last 168 hours. No results for input(s): AMMONIA in the last 168 hours.  CBC:  Recent  Labs Lab 01/13/15 0445 01/14/15 0421 01/15/15 0451 01/16/15 0437 01/17/15 0536  WBC 4.3 4.5 5.2 5.4 6.2  NEUTROABS 3.2 3.7 3.6 3.8 5.2  HGB 8.1* 7.9* 8.9* 8.7* 8.6*  HCT 25.6* 24.6* 27.4* 26.8* 27.0*  MCV 96.9 96.4 96.7 96.3 97.3  PLT 62* 60* 56* 60* 66*    Cardiac Enzymes: No results for input(s): CKTOTAL, CKMB, CKMBINDEX, TROPONINI in the last 168 hours.  BNP: Invalid input(s): POCBNP  CBG: No results for input(s): GLUCAP in the last 168 hours.  Microbiology: Results for orders placed or performed during the hospital encounter of 01/10/15  Wound culture     Status: None   Collection Time: 01/10/15  8:36 PM  Result Value Ref Range Status   Specimen Description LEG LEFT LEG  Final   Special Requests Normal  Final   Gram Stain   Final    RARE WBC SEEN RARE GRAM POSITIVE COCCI RARE GRAM NEGATIVE RODS    Culture   Final    MODERATE GROWTH PROTEUS MIRABILIS LIGHT GROWTH ESCHERICHIA COLI LIGHT GROWTH METHICILLIN RESISTANT STAPHYLOCOCCUS AUREUS LIGHT GROWTH GROUP B STREP(S.AGALACTIAE)ISOLATED Virtually 100% of S. agalactiae (Group B) strains are susceptible to Penicillin.  For Penicillin-allergic patients, Erythromycin (85-95%  sensitive) and Clindamycin (80% sensitive) are drugs of choice. Contact microbiology lab to request sensitivities if  needed within 7 days.    Report Status 01/14/2015 FINAL  Final   Organism ID, Bacteria PROTEUS MIRABILIS  Final   Organism ID, Bacteria ESCHERICHIA COLI  Final   Organism ID, Bacteria METHICILLIN RESISTANT STAPHYLOCOCCUS AUREUS  Final      Susceptibility   Escherichia coli - MIC*    AMPICILLIN >=32 RESISTANT Resistant     CEFAZOLIN <=4 SENSITIVE Sensitive     CEFTRIAXONE <=1 SENSITIVE Sensitive     CIPROFLOXACIN >=4 RESISTANT Resistant     GENTAMICIN >=16 RESISTANT Resistant     IMIPENEM <=0.25 SENSITIVE Sensitive     TRIMETH/SULFA >=320 RESISTANT Resistant     CEFOXITIN <=4 SENSITIVE Sensitive     PIP/TAZO Value in next row  Sensitive      SENSITIVE<=4    * LIGHT GROWTH ESCHERICHIA COLI   Proteus mirabilis - MIC*    AMPICILLIN Value in next row Sensitive      SENSITIVE<=4    CEFAZOLIN Value in next row Sensitive      SENSITIVE<=4    CEFTRIAXONE Value in next row Sensitive      SENSITIVE<=4    CIPROFLOXACIN Value in next row Sensitive      SENSITIVE<=4    GENTAMICIN Value in next row Sensitive      SENSITIVE<=4    IMIPENEM Value in next row Sensitive      SENSITIVE<=4    TRIMETH/SULFA Value in next row Sensitive      SENSITIVE<=4    CEFOXITIN Value in next row Sensitive      SENSITIVE<=4    PIP/TAZO Value in next row Sensitive      SENSITIVE<=4    * MODERATE GROWTH PROTEUS MIRABILIS   Methicillin resistant staphylococcus aureus - MIC*    CIPROFLOXACIN Value in next row Resistant      SENSITIVE<=4    GENTAMICIN Value in next row Sensitive      SENSITIVE<=4    OXACILLIN Value in next row Resistant      SENSITIVE<=4    VANCOMYCIN Value in next row Sensitive      SENSITIVE<=4    TRIMETH/SULFA Value in next row Sensitive      SENSITIVE<=4    CEFOXITIN SCREEN Value in next row Resistant      POSITIVECEFOXITIN SCREEN - This test may be used to predict mecA-mediated oxacillin resistance, and it is based on the cefoxitin disk screen test.  The cefoxitin screen and oxacillin work in combination to determine the final interpretation reported for oxacillin.     Inducible Clindamycin Value in next row Resistant      POSITIVEINDUCIBLE CLINDAMYCIN RESISTANCE - A positive ICR test is indicative of inducible resistance to macrolides, lincosamides, and type B streptogramin.  This isolate is presumed to be resistant to Clindamycin, however, Clindamycin may still be effective in some patients.     TETRACYCLINE Value in next row Sensitive      SENSITIVE<=1    CLINDAMYCIN Value in next row Resistant      RESISTANT>=8    LEVOFLOXACIN Value in next row Resistant      RESISTANT>=8    * LIGHT GROWTH METHICILLIN  RESISTANT STAPHYLOCOCCUS AUREUS    Coagulation Studies:  Recent Labs  01/15/15 0451 01/16/15 0437 01/17/15 0536  LABPROT 21.6* 21.9* 22.7*  INR 1.86 1.89 1.98    Urinalysis: No results for input(s): COLORURINE, LABSPEC, PHURINE, GLUCOSEU, HGBUR, BILIRUBINUR, KETONESUR, PROTEINUR, UROBILINOGEN,  NITRITE, LEUKOCYTESUR in the last 72 hours.  Invalid input(s): APPERANCEUR    Imaging: No results found.   Medications:   . sodium chloride 50 mL/hr at 01/17/15 0000   . allopurinol  100 mg Oral Daily  . cephALEXin  500 mg Oral 3 times per day  . collagenase   Topical Daily  . doxycycline  100 mg Oral Q12H  . feeding supplement (ENSURE ENLIVE)  237 mL Oral BID BM  . metoprolol tartrate  12.5 mg Oral BID  . mometasone-formoterol  2 puff Inhalation BID  . montelukast  10 mg Oral Daily  . sodium chloride  3 mL Intravenous Q12H  . warfarin  2 mg Oral q1800   acetaminophen **OR** acetaminophen, albuterol, alum & mag hydroxide-simeth, HYDROcodone-acetaminophen, ondansetron (ZOFRAN) IV, senna-docusate  Assessment/ Plan:  81 y.o. female with a PMHx of asthma, COPD, atrial fibrillation, gout, hypertension, chronic kidney disease stage III, chronic systolic heart failure, mitral regurgitation, who was admitted to [facility] on 01/10/2015 for evaluation of left lower extremity wound.   1. Acute renal failure due to ATN/CKD stage III baseline egfr 48. The patient was on multiple diuretic's including Lasix and spironolactone at the time of admission. Renal function worse today, some periods of hypotension noted which may be causing extension of ATN.  Continue NS at 50cc/hr.    2. Anemia of chronic kidney disease. Hgb noted to be 8.6.  SPEP/UPEP ordered, result pending.  Iron stores quite low. Will avoid IV iron but will start on PO ferrous sulfate.    LOS: 7 Kodi Steil 6/26/201611:04 AM

## 2015-01-17 NOTE — Progress Notes (Signed)
ANTICOAGULATION CONSULT NOTE   Pharmacy Consult for warfarin dosing Indication: atrial fibrillation  Allergies  Allergen Reactions  . Latex Rash  . Levofloxacin Rash  . Sulfa Antibiotics Rash    Patient Measurements: Height: 5\' 4"  (162.6 cm) Weight: 127 lb 6.4 oz (57.788 kg) IBW/kg (Calculated) : 54.7 Heparin Dosing Weight: n/a  Vital Signs: BP: 98/54 mmHg (06/26 0608) Pulse Rate: 132 (06/26 0003)  Labs:  Recent Labs  01/15/15 0451 01/16/15 0437 01/17/15 0536  HGB 8.9* 8.7*  --   HCT 27.4* 26.8*  --   PLT 56* 60*  --   LABPROT 21.6* 21.9* 22.7*  INR 1.86 1.89 1.98  CREATININE 1.56* 1.58* 2.17*    Estimated Creatinine Clearance: 15.8 mL/min (by C-G formula based on Cr of 2.17).   Medical History: Past Medical History  Diagnosis Date  . Asthma   . COPD (chronic obstructive pulmonary disease)   . A-fib   . Gout   . Essential hypertension   . Cardiomyopathy   . Chronic kidney disease   . Chronic systolic congestive heart failure   . Severe mitral regurgitation   . Mild aortic regurgitation   . Severe tricuspid regurgitation     Home med:  Coumadin 2mg  daily.  Assessment: 6/19 INR = 3.34, no coumadin given. 6/20 INR = 3.14, no coumadin given. 6/21 INR = 3.15, no coumadin given. 6/22 INR = 3.05, no coumadin given. 6/23 INR = 2.51, Coumadin 1mg  given. 6/24 INR = 1.86, Coumadin 2mg  given. 6/25 INR = 1.89, Coumadin 2mg  given. 6/26 INR = 1.98  Goal of Therapy:  INR 2-3   Plan:    Will continue Coumadin 2mg  daily.   Will check INR in the morning and reassess therapy.   Stormy Card, [place]   Clinical Pharmacist 01/17/2015

## 2015-01-18 ENCOUNTER — Inpatient Hospital Stay: Payer: Medicare Other

## 2015-01-18 LAB — BASIC METABOLIC PANEL
Anion gap: 12 (ref 5–15)
BUN: 95 mg/dL — ABNORMAL HIGH (ref 6–20)
CO2: 25 mmol/L (ref 22–32)
Calcium: 8.4 mg/dL — ABNORMAL LOW (ref 8.9–10.3)
Chloride: 95 mmol/L — ABNORMAL LOW (ref 101–111)
Creatinine, Ser: 1.9 mg/dL — ABNORMAL HIGH (ref 0.44–1.00)
GFR calc Af Amer: 26 mL/min — ABNORMAL LOW (ref >60)
GFR calc non Af Amer: 23 mL/min — ABNORMAL LOW (ref >60)
GLUCOSE: 83 mg/dL (ref 65–99)
POTASSIUM: 4.5 mmol/L (ref 3.5–5.1)
SODIUM: 132 mmol/L — AB (ref 135–145)

## 2015-01-18 LAB — CBC WITH DIFFERENTIAL/PLATELET
BASOS ABS: 0 10*3/uL (ref 0.0–0.1)
BASOS PCT: 0 % (ref 0–1)
Band Neutrophils: 3 % (ref 0–10)
Blasts: 0 %
EOS ABS: 0.1 10*3/uL (ref 0.0–0.7)
EOS PCT: 2 % (ref 0–5)
HCT: 25.8 % — ABNORMAL LOW (ref 35.0–47.0)
Hemoglobin: 8.3 g/dL — ABNORMAL LOW (ref 12.0–16.0)
Lymphocytes Relative: 16 % (ref 12–46)
Lymphs Abs: 0.9 10*3/uL (ref 0.7–4.0)
MCH: 30.6 pg (ref 26.0–34.0)
MCHC: 32 g/dL (ref 32.0–36.0)
MCV: 95.5 fL (ref 80.0–100.0)
METAMYELOCYTES PCT: 4 %
MONO ABS: 0.3 10*3/uL (ref 0.1–1.0)
MYELOCYTES: 1 %
Monocytes Relative: 5 % (ref 3–12)
NEUTROS PCT: 69 % (ref 43–77)
Neutro Abs: 4.5 10*3/uL (ref 1.7–7.7)
Other: 0 %
PLATELETS: 60 10*3/uL — AB (ref 150–440)
Promyelocytes Absolute: 0 %
RBC: 2.7 MIL/uL — AB (ref 3.80–5.20)
RDW: 18.6 % — ABNORMAL HIGH (ref 11.5–14.5)
SMEAR REVIEW: DECREASED
WBC: 5.8 10*3/uL (ref 3.6–11.0)
nRBC: 2 /100 WBC — ABNORMAL HIGH

## 2015-01-18 LAB — PROTEIN ELECTROPHORESIS, SERUM
A/G RATIO SPE: 1.1 (ref 0.7–1.7)
ALBUMIN ELP: 3.1 g/dL (ref 2.9–4.4)
ALPHA-1-GLOBULIN: 0.3 g/dL (ref 0.0–0.4)
ALPHA-2-GLOBULIN: 0.5 g/dL (ref 0.4–1.0)
BETA GLOBULIN: 1 g/dL (ref 0.7–1.3)
Gamma Globulin: 1 g/dL (ref 0.4–1.8)
Globulin, Total: 2.8 g/dL (ref 2.2–3.9)
TOTAL PROTEIN ELP: 5.9 g/dL — AB (ref 6.0–8.5)

## 2015-01-18 LAB — PROTIME-INR
INR: 2
Prothrombin Time: 22.8 seconds — ABNORMAL HIGH (ref 11.4–15.0)

## 2015-01-18 NOTE — Progress Notes (Signed)
[facility] [place] [facility]   PATIENT NAME: Alicia MastVirgie Purpura    MR#:  [id number]  DATE OF BIRTH:  1925-06-09  SUBJECTIVE:  CHIEF COMPLAINT:   Chief Complaint  Patient presents with  . Wound Check  No complaint.  On oxygen 3 L by nasal cannular.  REVIEW OF SYSTEMS:  CONSTITUTIONAL: No fever, generalized weakness.  EYES: No blurred or double vision.  EARS, NOSE, AND THROAT: No tinnitus or ear pain. Hard hearing. RESPIRATORY: has cough, but no shortness of breath, wheezing or hemoptysis.  CARDIOVASCULAR: No chest pain, orthopnea, but has leg edema.  GASTROINTESTINAL: No nausea, vomiting, diarrhea or abdominal pain.  GENITOURINARY: No dysuria, hematuria.  ENDOCRINE: No polyuria, nocturia,  HEMATOLOGY: No anemia, easy bruising or bleeding SKIN: No rash or lesion.  MUSCULOSKELETAL: No joint pain or arthritis.   NEUROLOGIC: No tingling, numbness, weakness.  PSYCHIATRY: No anxiety or depression.   DRUG ALLERGIES:   Allergies  Allergen Reactions  . Latex Rash  . Levofloxacin Rash  . Sulfa Antibiotics Rash    VITALS:  Blood pressure 101/56, pulse 81, temperature 98.1 F (36.7 C), temperature source Oral, resp. rate 18, height 5\' 4"  (1.626 m), weight 57.652 kg (127 lb 1.6 oz), SpO2 100 %.  PHYSICAL EXAMINATION:  GENERAL:  81 y.o.-year-old patient lying in the bed with no acute distress.  EYES: Pupils equal, round, reactive to light and accommodation. No scleral icterus. Extraocular muscles intact.  HEENT: Head atraumatic, normocephalic. Hard hearing. Oropharynx and nasopharynx clear. moist oral mucosa. NECK:  Supple, no jugular venous distention. No thyroid enlargement, no tenderness.  LUNGS: Normal breath sounds bilaterally, no wheezing, rales,rhonchi or crepitation. No use of accessory muscles of respiration.  CARDIOVASCULAR: S1, S2 normal. Systolic murmurs 2/6, no rubs, or gallops.  ABDOMEN: Soft, nontender, nondistended. Bowel sounds present.  No organomegaly or mass.  EXTREMITIES: Left leg in dressing, b/l leg edema 1+. No cyanosis, or clubbing.  NEUROLOGIC: Cranial nerves II through XII are intact. Muscle strength 4/5 in all extremities. Sensation intact. Gait not checked.  PSYCHIATRIC: The patient is alert and oriented x 3.  SKIN: No obvious rash, lesion, or ulcer.   LABORATORY PANEL:   CBC  Recent Labs Lab 01/18/15 0616  WBC 5.8  HGB 8.3*  HCT 25.8*  PLT 60*   ------------------------------------------------------------------------------------------------------------------  Chemistries   Recent Labs Lab 01/12/15 0502  01/18/15 0616  NA 137  < > 132*  K 3.4*  < > 4.5  CL 97*  < > 95*  CO2 31  < > 25  GLUCOSE 93  < > 83  BUN 90*  < > 95*  CREATININE 2.10*  < > 1.90*  CALCIUM 8.1*  < > 8.4*  MG 1.8  --   --   < > = values in this interval not displayed. ------------------------------------------------------------------------------------------------------------------  Cardiac Enzymes No results for input(s): TROPONINI in the last 168 hours. ------------------------------------------------------------------------------------------------------------------  RADIOLOGY:  Dg Chest 1 View  01/18/2015   CLINICAL DATA:  Lung crackles.  EXAM: CHEST  1 VIEW  COMPARISON:  01/13/2015  FINDINGS: Bilateral pleural effusions, right greater than left, slightly increased since prior study. There is cardiomegaly. Increasing bibasilar opacities, likely atelectasis. Right perihilar atelectasis or infiltrate.  IMPRESSION: Mildly increased bilateral pleural effusions and airspace opacities, both greater on the right.   Electronically Signed   By: Charlett NoseKevin  Dover M.D.   On: 01/18/2015 13:27    EKG:   Orders placed or performed during the hospital encounter of 01/10/15  .  EKG 12-Lead  . EKG 12-Lead    ASSESSMENT AND PLAN:   Acute on chronic kidney failure. due to ATN A litter better.dicontinue NS 50 ml/h,  f/u Dr. Thedore Mins and  BMP. Hold Lasix.  Hypokalemia. Improved after supplement. Hyponatremia. Cont NS iv.   Wound of left lower extremity. Wound culture showed Escherichia coli and Proteus Mirabilis and MRSA,  Discontinued vancomycin and Rocephin. cont Keflex and doxycycline for 4 more days. continue wound care.   Elevated INR - therapeutic, continue warfarin dose per pharmacy, follow-up INR. Atrial fibrillation - anticoagulation as above, continue Lopressor ( hold if SBP<100 or DBP<60).  Chronic systolic congestive heart failure (EF of 20%), stable. hold diuretics.   Essential hypertension -controlled. continue Lopressor. COPD (chronic obstructive pulmonary disease) - stable. continue home inhalers  Hypoxia. Continue On oxygen 3 L and nebulizer treatment. Pulse oximetry is not correct due to poor circulation of hand.   Anemia of chronic disease. Stable. Chronic thrombocytopenia. Stable  Continue PT.  The patient needs home health and PT.  All the records are reviewed and case discussed with Care Management/Social Workerr. Management plans discussed with the patient, RN, CM. Discussed with Dr. Thedore Mins.  CODE STATUS: Full code.  TOTAL TIME TAKING CARE OF THIS PATIENT: 39   minutes.   POSSIBLE D/C IN 3-4 DAYS, DEPENDING ON CLINICAL CONDITION.   Shaune Pollack M.D on 01/18/2015 at 1:55 PM  Between 7am to 6pm - Pager - [phone]  After 6pm go to [email or web address] - password [email or web address] [facility]  [facility]  Office  [phone]  CC: Primary care physician; Marisue Ivan, MD

## 2015-01-18 NOTE — Progress Notes (Signed)
ANTICOAGULATION CONSULT NOTE   Pharmacy Consult for warfarin dosing Indication: atrial fibrillation  Allergies  Allergen Reactions  . Latex Rash  . Levofloxacin Rash  . Sulfa Antibiotics Rash    Patient Measurements: Height: 5\' 4"  (162.6 cm) Weight: 127 lb 1.6 oz (57.652 kg) IBW/kg (Calculated) : 54.7 Heparin Dosing Weight: n/a  Vital Signs: Temp: 98.1 F (36.7 C) (06/27 0813) BP: 113/78 mmHg (06/27 0813) Pulse Rate: 79 (06/27 0813)  Labs:  Recent Labs  01/16/15 0437 01/17/15 0536 01/18/15 0616  HGB 8.7* 8.6* 8.3*  HCT 26.8* 27.0* 25.8*  PLT 60* 66* 60*  LABPROT 21.9* 22.7* 22.8*  INR 1.89 1.98 2.00  CREATININE 1.58* 2.17* 1.90*    Estimated Creatinine Clearance: 18 mL/min (by C-G formula based on Cr of 1.9).   Medical History: Past Medical History  Diagnosis Date  . Asthma   . COPD (chronic obstructive pulmonary disease)   . A-fib   . Gout   . Essential hypertension   . Cardiomyopathy   . Chronic kidney disease   . Chronic systolic congestive heart failure   . Severe mitral regurgitation   . Mild aortic regurgitation   . Severe tricuspid regurgitation     Home med:  Coumadin 2mg  daily.  Assessment: 6/19 INR = 3.34, no coumadin given. 6/20 INR = 3.14, no coumadin given. 6/21 INR = 3.15, no coumadin given. 6/22 INR = 3.05, no coumadin given. 6/23 INR = 2.51, Coumadin 1mg  given. 6/24 INR = 1.86, Coumadin 2mg  given. 6/25 INR = 1.89;   INR is therapeutic at 2.0  Goal of Therapy:  INR 2-3   Plan:   Will continue warfarin 2 mg PO daily.  Will check INR in the morning and reassess therapy.   Demetrius Charity, PharmD Clinical Pharmacist 01/18/2015

## 2015-01-18 NOTE — Care Management Note (Signed)
Case Management Note  Patient Details  Name: Alicia KannerVirgie K Beaudin MRN: [id number] Date of Birth: 02-Aug-1924  Subjective/Objective:                    Action/Plan:   Expected Discharge Date:                  Expected Discharge Plan:    In-House Referral:   [facility]  Discharge planning Services   Home with Home Health  Post Acute Care Choice:    Choice offered to:  Patient, Adult Children  DME Arranged:  N/A DME Agency:     HH Arranged:    HH Agency:  [facility]  Status of Service:  In process, will continue to follow  Medicare Important Message Given:  Yes Date Medicare IM Given:  01/11/15 Medicare IM give by:   Kelton Pillar(L.Sabrena Gavitt) Date Additional Medicare IM Given:  01/15/15    01/18/15 Additional Medicare Important Message give by:  Collie SiadAngela Johnson    L.Ajayla Iglesias, RN  If discussed at [employer] Meetings, dates discussed:    Additional Comments:  Delva Derden A, RN 01/18/2015, 9:56 AM

## 2015-01-18 NOTE — Progress Notes (Signed)
Subjective:  Overall doing fair Sitting up in chair Denies acute c/o  Objective:  Vital signs in last 24 hours:  Temp:  [97.3 F (36.3 C)-98.1 F (36.7 C)] 98.1 F (36.7 C) (06/27 0813) Pulse Rate:  [77-81] 81 (06/27 1029) Resp:  [18-20] 18 (06/27 0813) BP: (96-113)/(54-78) 101/56 mmHg (06/27 1029) SpO2:  [82 %-100 %] 100 % (06/27 0813) Weight:  [57.652 kg (127 lb 1.6 oz)] 57.652 kg (127 lb 1.6 oz) (06/27 0418)  Weight change: 0.635 kg (1 lb 6.4 oz) Filed Weights   01/16/15 0700 01/17/15 0608 01/18/15 0418  Weight: 57.788 kg (127 lb 6.4 oz) 57.017 kg (125 lb 11.2 oz) 57.652 kg (127 lb 1.6 oz)    Intake/Output: I/O last 3 completed shifts: In: 1217.7 [P.O.:240; I.V.:977.7] Out: 700 [Urine:700]   Intake/Output this shift:  Total I/O In: 0  Out: 250 [Urine:250]  Physical Exam: General: Frail appearing  Head: Normocephalic, atraumatic. Moist oral mucosal membranes  Eyes: Anicteric  Neck: Supple, trachea midline  Lungs:  Bilateral rhonchi, normal effort  Heart: S1S2, no rubs  Abdomen:  Soft, nontender, BS present   Extremities:  1+ RLE edema, left leg wrapped  Neurologic: Awake, follows commands, hard of hearing  Skin: No lesions       Basic Metabolic Panel:  Recent Labs Lab 01/12/15 0502  01/14/15 0421 01/15/15 0451 01/16/15 0437 01/17/15 0536 01/18/15 0616  NA 137  < > 139 135 136 133* 132*  K 3.4*  < > 3.6 3.7 4.2 4.3 4.5  CL 97*  < > 96* 96* 98* 95* 95*  CO2 31  < > 30 29 29 26 25   GLUCOSE 93  < > 105* 104* 90 84 83  BUN 90*  < > 76* 74* 78* 93* 95*  CREATININE 2.10*  < > 1.66* 1.56* 1.58* 2.17* 1.90*  CALCIUM 8.1*  < > 8.1* 7.9* 8.3* 8.2* 8.4*  MG 1.8  --   --   --   --   --   --   < > = values in this interval not displayed.  Liver Function Tests: No results for input(s): AST, ALT, ALKPHOS, BILITOT, PROT, ALBUMIN in the last 168 hours. No results for input(s): LIPASE, AMYLASE in the last 168 hours. No results for input(s): AMMONIA in the last  168 hours.  CBC:  Recent Labs Lab 01/14/15 0421 01/15/15 0451 01/16/15 0437 01/17/15 0536 01/18/15 0616  WBC 4.5 5.2 5.4 6.2 5.8  NEUTROABS 3.7 3.6 3.8 5.2 4.5  HGB 7.9* 8.9* 8.7* 8.6* 8.3*  HCT 24.6* 27.4* 26.8* 27.0* 25.8*  MCV 96.4 96.7 96.3 97.3 95.5  PLT 60* 56* 60* 66* 60*    Cardiac Enzymes: No results for input(s): CKTOTAL, CKMB, CKMBINDEX, TROPONINI in the last 168 hours.  BNP: Invalid input(s): POCBNP  CBG: No results for input(s): GLUCAP in the last 168 hours.  Microbiology: Results for orders placed or performed during the hospital encounter of 01/10/15  Wound culture     Status: None   Collection Time: 01/10/15  8:36 PM  Result Value Ref Range Status   Specimen Description LEG LEFT LEG  Final   Special Requests Normal  Final   Gram Stain   Final    RARE WBC SEEN RARE GRAM POSITIVE COCCI RARE GRAM NEGATIVE RODS    Culture   Final    MODERATE GROWTH PROTEUS MIRABILIS LIGHT GROWTH ESCHERICHIA COLI LIGHT GROWTH METHICILLIN RESISTANT STAPHYLOCOCCUS AUREUS LIGHT GROWTH GROUP B STREP(S.AGALACTIAE)ISOLATED Virtually 100% of S. agalactiae (  Group B) strains are susceptible to Penicillin.  For Penicillin-allergic patients, Erythromycin (85-95% sensitive) and Clindamycin (80% sensitive) are drugs of choice. Contact microbiology lab to request sensitivities if  needed within 7 days.    Report Status 01/14/2015 FINAL  Final   Organism ID, Bacteria PROTEUS MIRABILIS  Final   Organism ID, Bacteria ESCHERICHIA COLI  Final   Organism ID, Bacteria METHICILLIN RESISTANT STAPHYLOCOCCUS AUREUS  Final      Susceptibility   Escherichia coli - MIC*    AMPICILLIN >=32 RESISTANT Resistant     CEFAZOLIN <=4 SENSITIVE Sensitive     CEFTRIAXONE <=1 SENSITIVE Sensitive     CIPROFLOXACIN >=4 RESISTANT Resistant     GENTAMICIN >=16 RESISTANT Resistant     IMIPENEM <=0.25 SENSITIVE Sensitive     TRIMETH/SULFA >=320 RESISTANT Resistant     CEFOXITIN <=4 SENSITIVE Sensitive      PIP/TAZO Value in next row Sensitive      SENSITIVE<=4    * LIGHT GROWTH ESCHERICHIA COLI   Proteus mirabilis - MIC*    AMPICILLIN Value in next row Sensitive      SENSITIVE<=4    CEFAZOLIN Value in next row Sensitive      SENSITIVE<=4    CEFTRIAXONE Value in next row Sensitive      SENSITIVE<=4    CIPROFLOXACIN Value in next row Sensitive      SENSITIVE<=4    GENTAMICIN Value in next row Sensitive      SENSITIVE<=4    IMIPENEM Value in next row Sensitive      SENSITIVE<=4    TRIMETH/SULFA Value in next row Sensitive      SENSITIVE<=4    CEFOXITIN Value in next row Sensitive      SENSITIVE<=4    PIP/TAZO Value in next row Sensitive      SENSITIVE<=4    * MODERATE GROWTH PROTEUS MIRABILIS   Methicillin resistant staphylococcus aureus - MIC*    CIPROFLOXACIN Value in next row Resistant      SENSITIVE<=4    GENTAMICIN Value in next row Sensitive      SENSITIVE<=4    OXACILLIN Value in next row Resistant      SENSITIVE<=4    VANCOMYCIN Value in next row Sensitive      SENSITIVE<=4    TRIMETH/SULFA Value in next row Sensitive      SENSITIVE<=4    CEFOXITIN SCREEN Value in next row Resistant      POSITIVECEFOXITIN SCREEN - This test may be used to predict mecA-mediated oxacillin resistance, and it is based on the cefoxitin disk screen test.  The cefoxitin screen and oxacillin work in combination to determine the final interpretation reported for oxacillin.     Inducible Clindamycin Value in next row Resistant      POSITIVEINDUCIBLE CLINDAMYCIN RESISTANCE - A positive ICR test is indicative of inducible resistance to macrolides, lincosamides, and type B streptogramin.  This isolate is presumed to be resistant to Clindamycin, however, Clindamycin may still be effective in some patients.     TETRACYCLINE Value in next row Sensitive      SENSITIVE<=1    CLINDAMYCIN Value in next row Resistant      RESISTANT>=8    LEVOFLOXACIN Value in next row Resistant      RESISTANT>=8    * LIGHT  GROWTH METHICILLIN RESISTANT STAPHYLOCOCCUS AUREUS  C difficile quick scan w PCR reflex ([facility] only)     Status: None   Collection Time: 01/17/15  5:34 PM  Result Value Ref Range  Status   C Diff antigen NEGATIVE  Final   C Diff toxin NEGATIVE  Final   C Diff interpretation Negative for C. difficile  Final    Coagulation Studies:  Recent Labs  01/16/15 0437 01/17/15 0536 01/18/15 0616  LABPROT 21.9* 22.7* 22.8*  INR 1.89 1.98 2.00    Urinalysis: No results for input(s): COLORURINE, LABSPEC, PHURINE, GLUCOSEU, HGBUR, BILIRUBINUR, KETONESUR, PROTEINUR, UROBILINOGEN, NITRITE, LEUKOCYTESUR in the last 72 hours.  Invalid input(s): APPERANCEUR    Imaging: No results found.   Medications:     . allopurinol  100 mg Oral Daily  . cephALEXin  500 mg Oral 3 times per day  . collagenase   Topical Daily  . doxycycline  100 mg Oral Q12H  . feeding supplement (ENSURE ENLIVE)  237 mL Oral BID BM  . ferrous sulfate  325 mg Oral BID WC  . metoprolol tartrate  12.5 mg Oral BID  . mometasone-formoterol  2 puff Inhalation BID  . montelukast  10 mg Oral Daily  . sodium chloride  3 mL Intravenous Q12H  . warfarin  2 mg Oral q1800   acetaminophen **OR** acetaminophen, albuterol, alum & mag hydroxide-simeth, HYDROcodone-acetaminophen, ondansetron (ZOFRAN) IV, senna-docusate  Assessment/ Plan:  81 y.o. female with a PMHx of asthma, COPD, atrial fibrillation, gout, hypertension, chronic kidney disease stage III, chronic systolic heart failure, mitral regurgitation, who was admitted to [facility] on 01/10/2015 for evaluation of left lower extremity wound.   1. Acute renal failure due to ATN/CKD stage III baseline egfr 48. The patient was on multiple diuretic's including Lasix and spironolactone at the time of admission. slight improvement in S Creatinine.  monitor  2. LE edema - agree with d/c iv fluids - obtain CXR - Use of diuretics is limited as patient's BP is low normal   LOS:  8 Alicia Trujillo 6/27/201611:07 AM

## 2015-01-19 LAB — BASIC METABOLIC PANEL
Anion gap: 8 (ref 5–15)
BUN: 97 mg/dL — ABNORMAL HIGH (ref 6–20)
CO2: 27 mmol/L (ref 22–32)
Calcium: 8.4 mg/dL — ABNORMAL LOW (ref 8.9–10.3)
Chloride: 95 mmol/L — ABNORMAL LOW (ref 101–111)
Creatinine, Ser: 1.94 mg/dL — ABNORMAL HIGH (ref 0.44–1.00)
GFR calc Af Amer: 26 mL/min — ABNORMAL LOW (ref >60)
GFR calc non Af Amer: 22 mL/min — ABNORMAL LOW (ref >60)
GLUCOSE: 84 mg/dL (ref 65–99)
POTASSIUM: 5 mmol/L (ref 3.5–5.1)
SODIUM: 130 mmol/L — AB (ref 135–145)

## 2015-01-19 LAB — CBC WITH DIFFERENTIAL/PLATELET
BAND NEUTROPHILS: 0 % (ref 0–10)
BASOS PCT: 0 % (ref 0–1)
BLASTS: 0 %
Basophils Absolute: 0 10*3/uL (ref 0.0–0.1)
Eosinophils Absolute: 0.1 10*3/uL (ref 0.0–0.7)
Eosinophils Relative: 1 % (ref 0–5)
HEMATOCRIT: 25.9 % — AB (ref 35.0–47.0)
HEMOGLOBIN: 8.2 g/dL — AB (ref 12.0–16.0)
LYMPHS PCT: 5 % — AB (ref 12–46)
Lymphs Abs: 0.3 10*3/uL — ABNORMAL LOW (ref 0.7–4.0)
MCH: 30.3 pg (ref 26.0–34.0)
MCHC: 31.7 g/dL — AB (ref 32.0–36.0)
MCV: 95.8 fL (ref 80.0–100.0)
Metamyelocytes Relative: 0 %
Monocytes Absolute: 0.2 10*3/uL (ref 0.1–1.0)
Monocytes Relative: 3 % (ref 3–12)
Myelocytes: 1 %
NEUTROS ABS: 5.5 10*3/uL (ref 1.7–7.7)
NRBC: 10 /100{WBCs} — AB
Neutrophils Relative %: 90 % — ABNORMAL HIGH (ref 43–77)
OTHER: 0 %
PROMYELOCYTES ABS: 0 %
Platelets: 63 10*3/uL — ABNORMAL LOW (ref 150–440)
RBC: 2.7 MIL/uL — ABNORMAL LOW (ref 3.80–5.20)
RDW: 18.6 % — AB (ref 11.5–14.5)
WBC: 6.1 10*3/uL (ref 3.6–11.0)

## 2015-01-19 LAB — PROTIME-INR
INR: 2.27
Prothrombin Time: 25.2 seconds — ABNORMAL HIGH (ref 11.4–15.0)

## 2015-01-19 MED ORDER — ALUM & MAG HYDROXIDE-SIMETH 200-200-20 MG/5ML PO SUSP: 30.0000 mL | Freq: Four times a day (QID) | ORAL | Status: AC | PRN

## 2015-01-19 MED ORDER — CEPHALEXIN 500 MG PO CAPS: 500.0000 mg | ORAL_CAPSULE | Freq: Three times a day (TID) | ORAL | Status: AC

## 2015-01-19 MED ORDER — DOXYCYCLINE HYCLATE 100 MG PO TABS: 100.0000 mg | ORAL_TABLET | Freq: Two times a day (BID) | ORAL | Status: AC

## 2015-01-19 MED ORDER — FERROUS SULFATE 325 (65 FE) MG PO TABS: 325.0000 mg | ORAL_TABLET | Freq: Two times a day (BID) | ORAL | Status: AC

## 2015-01-19 NOTE — Discharge Instructions (Signed)
Pt. should resume the plan for follow-up with the outpatient wound care center after discharge per wound care nurse consult related to left lower leg wound.   Pt could benefit from follow-up with home health nursing for dressing changes after discharge, please order if desired per wound care nurse consult.   Home health with RN, nurse aid and PT Low sodium diet Activity as tolerated.

## 2015-01-19 NOTE — Discharge Summary (Signed)
[facility] - [place] at [facility]   PATIENT NAME: Alicia Trujillo    MR#:  [id number]  DATE OF BIRTH:  09/30/1924  DATE OF ADMISSION:  01/10/2015 ADMITTING PHYSICIAN: Oralia Manis, MD  DATE OF DISCHARGE:  01/19/2015 PRIMARY CARE PHYSICIAN: Marisue Ivan, MD    ADMISSION DIAGNOSIS:  Wound infection, initial encounter [T79.8XXA] Acute renal failure, unspecified acute renal failure type [N17.9]   DISCHARGE DIAGNOSIS:   Acute on chronic kidney failure. due to ATN Wound of left lower extremity Elevated INR Hypoxia Chronic systolic congestive heart failure (EF of 20%)  SECONDARY DIAGNOSIS:   Past Medical History  Diagnosis Date  . Asthma   . COPD (chronic obstructive pulmonary disease)   . A-fib   . Gout   . Essential hypertension   . Cardiomyopathy   . Chronic kidney disease   . Chronic systolic congestive heart failure   . Severe mitral regurgitation   . Mild aortic regurgitation   . Severe tricuspid regurgitation     HOSPITAL COURSE:  Alicia Trujillo is a 81 y.o. female who presents with bleeding hematoma and left lower extremity wound infection. This is a patient who is on chronic anticoagulation for atrial fibrillation, and has had fluctuating INR of the last several weeks. Most notably she's been more often supratherapeutic. She developed a hematoma in her left lower extremity, and then, per her son who is in the room with her at the time of interview, "bumped" the wound causing it to rupture. He brought her in tonight because he was worried that she started to develop an infection in that area as her skin surrounding the wound has become more erythematous. Of note she has chronic systolic congestive heart failure with an EF of 20% on last echo late 2015, and per her son has recently been receiving increasing doses of diuretics in order to manage her lower extremity edema. She was found to have a significantly elevated serum creatinine.   Acute  on chronic kidney failure. due to ATN Lasix, metolazone and spironolactone were discontinued due to renal failure. The patient has been treated with NS 50 ml/h, renal function has been gradually improving.Creatinine decreased to 1.94 but BUN is still high at 97 today. According to Dr. Thedore Mins,  The patient may be discharged to home in the follow-up BMP as outpatient.  Hypokalemia. Improved after supplement. Hyponatremia. Treated with NS iv.   Wound of left lower extremity. The patient was treated with vancomycin and Rocephin. Wound culture showed Escherichia coli and Proteus Mirabilis and MRSA, per Dr. Sampson Goon, changed to po Keflex and doxycycline for 7days. continue wound care.  Elevated INR -INR was elevated more than 3, Coumadin was on hold and resumed after decreaseing to therapeutic range. Continue warfarin and follow-up INR as outpatient. Atrial fibrillation - anticoagulation as above, continue Lopressor ( hold if SBP<100 or DBP<60).  Chronic systolic congestive heart failure (EF of 20%), stable. hold diuretics.  Essential hypertension -controlled. continue Lopressor. COPD (chronic obstructive pulmonary disease) - stable. continue home inhalers  Hypoxia. Continue On oxygen 3 L and nebulizer treatment.  Anemia of chronic disease. Stable. Chronic thrombocytopenia. Stable  Patient also underwent  PT.   DISCHARGE CONDITIONS:   Stable. The patient will be discharged to home with home health and PT today.  CONSULTS OBTAINED:  Treatment Team:  Dalia Heading, MD Mady Haagensen, MD  DRUG ALLERGIES:   Allergies  Allergen Reactions  . Latex Rash  . Levofloxacin Rash  . Sulfa Antibiotics Rash  DISCHARGE MEDICATIONS:   Current Discharge Medication List    START taking these medications   Details  alum & mag hydroxide-simeth (MAALOX/MYLANTA) 200-200-20 MG/5ML suspension Take 30 mLs by mouth every 6 (six) hours as needed for indigestion or heartburn. Qty: 355 mL,  Refills: 0    cephALEXin (KEFLEX) 500 MG capsule Take 1 capsule (500 mg total) by mouth 3 (three) times daily. Qty: 9 capsule, Refills: 0    doxycycline (VIBRA-TABS) 100 MG tablet Take 1 tablet (100 mg total) by mouth 2 (two) times daily. Qty: 6 tablet, Refills: 0    ferrous sulfate 325 (65 FE) MG tablet Take 1 tablet (325 mg total) by mouth 2 (two) times daily with a meal. Qty: 60 tablet, Refills: 0      CONTINUE these medications which have NOT CHANGED   Details  albuterol (PROAIR HFA) 108 (90 BASE) MCG/ACT inhaler Inhale 2 puffs into the lungs every 4 (four) hours as needed.    allopurinol (ZYLOPRIM) 100 MG tablet Take 100 mg by mouth daily. Refills: 0    calcium-vitamin D (CALCIUM 500/D) 500-200 MG-UNIT per tablet Take 1 tablet by mouth daily.    fexofenadine (ALLEGRA) 180 MG tablet Take 180 mg by mouth daily.    Fluticasone-Salmeterol (ADVAIR DISKUS) 250-50 MCG/DOSE AEPB Inhale 1 puff into the lungs every 12 (twelve) hours.    metoprolol tartrate (LOPRESSOR) 25 MG tablet Take 12.5 mg by mouth 2 (two) times daily.    montelukast (SINGULAIR) 10 MG tablet Take 10 mg by mouth daily.    Multiple Vitamin (MULTI-VITAMINS) TABS Take 1 tablet by mouth daily.    warfarin (COUMADIN) 2 MG tablet Take 2 mg by mouth daily.      STOP taking these medications     furosemide (LASIX) 40 MG tablet      metolazone (ZAROXOLYN) 2.5 MG tablet      potassium chloride (K-DUR) 10 MEQ tablet      spironolactone (ALDACTONE) 25 MG tablet          DISCHARGE INSTRUCTIONS:    If you experience worsening of your admission symptoms, develop shortness of breath, life threatening emergency, suicidal or homicidal thoughts you must seek medical attention immediately by calling 911 or calling your MD immediately  if symptoms less severe.  You Must read complete instructions/literature along with all the possible adverse reactions/side effects for all the Medicines you take and that have been  prescribed to you. Take any new Medicines after you have completely understood and accept all the possible adverse reactions/side effects.   Please note  You were cared for by a hospitalist during your hospital stay. If you have any questions about your discharge medications or the care you received while you were in the hospital after you are discharged, you can call the unit and asked to speak with the hospitalist on call if the hospitalist that took care of you is not available. Once you are discharged, your primary care physician will handle any further medical issues. Please note that NO REFILLS for any discharge medications will be authorized once you are discharged, as it is imperative that you return to your primary care physician (or establish a relationship with a primary care physician if you do not have one) for your aftercare needs so that they can reassess your need for medications and monitor your lab values.    Today   SUBJECTIVE   No complaint   VITAL SIGNS:  Blood pressure 102/59, pulse 67, temperature 97.4 F (36.3  C), temperature source Oral, resp. rate 16, height 5\' 4"  (1.626 m), weight 59.512 kg (131 lb 3.2 oz), SpO2 72 %.  I/O:   Intake/Output Summary (Last 24 hours) at 01/19/15 1300 Last data filed at 01/19/15 1100  Gross per 24 hour  Intake    360 ml  Output    500 ml  Net   -140 ml    PHYSICAL EXAMINATION:  GENERAL:  81 y.o.-year-old patient lying in the bed with no acute distress. On oxygen and it cannular 3 L EYES: Pupils equal, round, reactive to light and accommodation. No scleral icterus. Extraocular muscles intact.  HEENT: Head atraumatic, normocephalic. Oropharynx and nasopharynx clear.  NECK:  Supple, no jugular venous distention. No thyroid enlargement, no tenderness.  LUNGS: Normal breath sounds bilaterally, no wheezing, rales,rhonchi or crepitation. No use of accessory muscles of respiration.  CARDIOVASCULAR: S1, S2 normal. No murmurs, rubs, or  gallops.  ABDOMEN: Soft, non-tender, non-distended. Bowel sounds present. No organomegaly or mass.  EXTREMITIES: Leg edema, no  cyanosis, or clubbing.  NEUROLOGIC: Cranial nerves II through XII are intact. Muscle strength 5/5 in all extremities. Sensation intact. Gait not checked.  PSYCHIATRIC: The patient is alert and oriented x 3.  SKIN: No obvious rash, lesion, or ulcer.   DATA REVIEW:   CBC  Recent Labs Lab 01/19/15 0432  WBC 6.1  HGB 8.2*  HCT 25.9*  PLT 63*    Chemistries   Recent Labs Lab 01/19/15 0432  NA 130*  K 5.0  CL 95*  CO2 27  GLUCOSE 84  BUN 97*  CREATININE 1.94*  CALCIUM 8.4*    Cardiac Enzymes No results for input(s): TROPONINI in the last 168 hours.  Microbiology Results  Results for orders placed or performed during the hospital encounter of 01/10/15  Wound culture     Status: None   Collection Time: 01/10/15  8:36 PM  Result Value Ref Range Status   Specimen Description LEG LEFT LEG  Final   Special Requests Normal  Final   Gram Stain   Final    RARE WBC SEEN RARE GRAM POSITIVE COCCI RARE GRAM NEGATIVE RODS    Culture   Final    MODERATE GROWTH PROTEUS MIRABILIS LIGHT GROWTH ESCHERICHIA COLI LIGHT GROWTH METHICILLIN RESISTANT STAPHYLOCOCCUS AUREUS LIGHT GROWTH GROUP B STREP(S.AGALACTIAE)ISOLATED Virtually 100% of S. agalactiae (Group B) strains are susceptible to Penicillin.  For Penicillin-allergic patients, Erythromycin (85-95% sensitive) and Clindamycin (80% sensitive) are drugs of choice. Contact microbiology lab to request sensitivities if  needed within 7 days.    Report Status 01/14/2015 FINAL  Final   Organism ID, Bacteria PROTEUS MIRABILIS  Final   Organism ID, Bacteria ESCHERICHIA COLI  Final   Organism ID, Bacteria METHICILLIN RESISTANT STAPHYLOCOCCUS AUREUS  Final      Susceptibility   Escherichia coli - MIC*    AMPICILLIN >=32 RESISTANT Resistant     CEFAZOLIN <=4 SENSITIVE Sensitive     CEFTRIAXONE <=1 SENSITIVE  Sensitive     CIPROFLOXACIN >=4 RESISTANT Resistant     GENTAMICIN >=16 RESISTANT Resistant     IMIPENEM <=0.25 SENSITIVE Sensitive     TRIMETH/SULFA >=320 RESISTANT Resistant     CEFOXITIN <=4 SENSITIVE Sensitive     PIP/TAZO Value in next row Sensitive      SENSITIVE<=4    * LIGHT GROWTH ESCHERICHIA COLI   Proteus mirabilis - MIC*    AMPICILLIN Value in next row Sensitive      SENSITIVE<=4    CEFAZOLIN Value in  next row Sensitive      SENSITIVE<=4    CEFTRIAXONE Value in next row Sensitive      SENSITIVE<=4    CIPROFLOXACIN Value in next row Sensitive      SENSITIVE<=4    GENTAMICIN Value in next row Sensitive      SENSITIVE<=4    IMIPENEM Value in next row Sensitive      SENSITIVE<=4    TRIMETH/SULFA Value in next row Sensitive      SENSITIVE<=4    CEFOXITIN Value in next row Sensitive      SENSITIVE<=4    PIP/TAZO Value in next row Sensitive      SENSITIVE<=4    * MODERATE GROWTH PROTEUS MIRABILIS   Methicillin resistant staphylococcus aureus - MIC*    CIPROFLOXACIN Value in next row Resistant      SENSITIVE<=4    GENTAMICIN Value in next row Sensitive      SENSITIVE<=4    OXACILLIN Value in next row Resistant      SENSITIVE<=4    VANCOMYCIN Value in next row Sensitive      SENSITIVE<=4    TRIMETH/SULFA Value in next row Sensitive      SENSITIVE<=4    CEFOXITIN SCREEN Value in next row Resistant      POSITIVECEFOXITIN SCREEN - This test may be used to predict mecA-mediated oxacillin resistance, and it is based on the cefoxitin disk screen test.  The cefoxitin screen and oxacillin work in combination to determine the final interpretation reported for oxacillin.     Inducible Clindamycin Value in next row Resistant      POSITIVEINDUCIBLE CLINDAMYCIN RESISTANCE - A positive ICR test is indicative of inducible resistance to macrolides, lincosamides, and type B streptogramin.  This isolate is presumed to be resistant to Clindamycin, however, Clindamycin may still be  effective in some patients.     TETRACYCLINE Value in next row Sensitive      SENSITIVE<=1    CLINDAMYCIN Value in next row Resistant      RESISTANT>=8    LEVOFLOXACIN Value in next row Resistant      RESISTANT>=8    * LIGHT GROWTH METHICILLIN RESISTANT STAPHYLOCOCCUS AUREUS  C difficile quick scan w PCR reflex ([facility] only)     Status: None   Collection Time: 01/17/15  5:34 PM  Result Value Ref Range Status   C Diff antigen NEGATIVE  Final   C Diff toxin NEGATIVE  Final   C Diff interpretation Negative for C. difficile  Final    RADIOLOGY:  Dg Chest 1 View  01/18/2015   CLINICAL DATA:  Lung crackles.  EXAM: CHEST  1 VIEW  COMPARISON:  01/13/2015  FINDINGS: Bilateral pleural effusions, right greater than left, slightly increased since prior study. There is cardiomegaly. Increasing bibasilar opacities, likely atelectasis. Right perihilar atelectasis or infiltrate.  IMPRESSION: Mildly increased bilateral pleural effusions and airspace opacities, both greater on the right.   Electronically Signed   By: Charlett Nose M.D.   On: 01/18/2015 13:27        Discharge plans discussed with the patient,  Her son, RN, CM,  Dr. Thedore Mins, and they are in agreement.  CODE STATUS:     Code Status Orders        Start     Ordered   01/11/15 0121  Full code   Continuous     01/11/15 0120    Advance Directive Documentation        Most Recent Value   Type of  Advance Directive  Healthcare Power of Attorney   Pre-existing out of facility DNR order (yellow form or pink MOST form)     "MOST" Form in Place?        TOTAL TIME TAKING CARE OF THIS PATIENT: 39 minutes.    Shaune Pollackhen, Fitzgerald Dunne M.D on 01/19/2015 at 1:00 PM  Between 7am to 6pm - Pager - [phone]  After 6pm go to [email or web address] - password [email or web address] [facility]  [facility]  Office  [phone]  CC: Primary care physician; Marisue IvanLINTHAVONG, KANHKA, MD

## 2015-01-19 NOTE — Progress Notes (Signed)
Physical Therapy Treatment Patient Details Name: Alicia KannerVirgie K Trujillo MRN: [id number] DOB: August 02, 1924 Today's Date: 01/19/2015    History of Present Illness presented to ER from home secondary to L LE injury/drainage with foul odor; admitted with L LE infection/wound and acute kidney failure.    PT Comments    Pt is struggling to follow PT instructions as she is very poor in hearing.  Her POC is still appropriate but will need some help at home to manage given her low endurance and poor safety awareness at times.  Follow Up Recommendations  Home health PT     Equipment Recommendations       Recommendations for Other Services       Precautions / Restrictions Precautions Precautions: Fall Restrictions Weight Bearing Restrictions: No    Mobility  Bed Mobility               General bed mobility comments: up when PT entered  Transfers Overall transfer level: Needs assistance Equipment used: Rolling walker (2 wheeled) Transfers: Sit to/from [employer] CorporationStand;Stand Pivot Transfers Sit to Stand: Min assist Stand pivot transfers: Min assist;Min guard       General transfer comment: cued hand placement and talked with her about trying to sit without being lined up to chair  Ambulation/Gait Ambulation/Gait assistance: Min assist;Min guard Ambulation Distance (Feet): 80 Feet Assistive device: Rolling walker (2 wheeled) Gait Pattern/deviations: Step-through pattern;Wide base of support;Drifts right/left Gait velocity: slow Gait velocity interpretation: Below normal speed for age/gender General Gait Details: Pt walked in 2 trips with no O2 to see what happened to her sats, with pre-gait being 100% and post being 95%.  No O2 used for walks and pt was not especially SOB  post   Stairs            Wheelchair Mobility    Modified Rankin (Stroke Patients Only)       Balance Overall balance assessment: Needs assistance Sitting-balance support: Feet supported Sitting  balance-Leahy Scale: Good     Standing balance support: Bilateral upper extremity supported Standing balance-Leahy Scale: Fair Standing balance comment: Pt is struggling to control her walker and has dense cues for safety as wel, but poor hearing to instruct her.                    Cognition Arousal/Alertness: Awake/alert Behavior During Therapy: WFL for tasks assessed/performed Overall Cognitive Status: Within Functional Limits for tasks assessed                      Exercises General Exercises - Lower Extremity Ankle Circles/Pumps: AROM;Both;10 reps Short Arc Quad: AROM;Both;10 reps    General Comments General comments (skin integrity, edema, etc.): Pt was able to maintain her sats with no O2 on and went from 100% on 3L O2 to 95% with two walks.  Has poor LE circulation and can only go the two trips of 6540' each before having to sit      Pertinent Vitals/Pain Pain Assessment: No/denies pain    Home Living                      Prior Function            PT Goals (current goals can now be found in the care plan section) Acute Rehab PT Goals Patient Stated Goal: none stated Progress towards PT goals: Progressing toward goals    Frequency  Min 2X/week    PT Plan Current plan remains  appropriate    Co-evaluation             End of Session Equipment Utilized During Treatment: Gait belt Activity Tolerance: Patient limited by fatigue Patient left: in chair;with call bell/phone within reach     Time: 1102-1128 PT Time Calculation (min) (ACUTE ONLY): 26 min  Charges:  $Gait Training: 8-22 mins $Therapeutic Activity: 8-22 mins                    G Codes:      Ivar Drape 01-31-15, 1:01 PM   Samul Dada, PT MS Acute Rehab Dept. Number: [facility] [id number] and [facility] [phone]

## 2015-01-19 NOTE — Care Management (Signed)
Patient will be discharged today with Home Health PT and O2. Oxygen delivered to room by Will Dareen PianoAnderson from [facility].

## 2015-01-19 NOTE — Progress Notes (Signed)
Alert and oriented. Vss. No sign of acute distress. Discharge instructions given. Patient and family verbalized understanding.

## 2015-01-19 NOTE — Evaluation (Signed)
Clinical/Bedside Swallow Evaluation Patient Details  Name: Alicia KannerVirgie K Zabinski MRN: [id number] Date of Birth: 01-Oct-1924  Today's Date: 01/19/2015 Time: SLP Start Time (ACUTE ONLY): 1145 SLP Stop Time (ACUTE ONLY): 1210 SLP Time Calculation (min) (ACUTE ONLY): 25 min  Past Medical History:  Past Medical History  Diagnosis Date  . Asthma   . COPD (chronic obstructive pulmonary disease)   . A-fib   . Gout   . Essential hypertension   . Cardiomyopathy   . Chronic kidney disease   . Chronic systolic congestive heart failure   . Severe mitral regurgitation   . Mild aortic regurgitation   . Severe tricuspid regurgitation    Past Surgical History:  Past Surgical History  Procedure Laterality Date  . Abdominal hysterectomy    . Bladder surgery     HPI:      Assessment / Plan / Recommendation Clinical Impression  pt presents with no dysphagia noted. pt did not have ssx aspiraiton with solids, or liquid trials. ST educated pt and family on diet recommendations to remain on a regular diet at home with liuqids to remain thin. pt is allowed straws if desired. pt was noted to have decreased rate of intake, and increased oral transit time, owever family states this is typical for her.    Aspiration Risk       Diet Recommendation Age appropriate regular solids;Thin   Medication Administration: Whole meds with liquid Compensations: Follow solids with liquid;Slow rate    Other  Recommendations     Follow Up Recommendations       Frequency and Duration        Pertinent Vitals/Pain None reported   SLP Swallow Goals     Swallow Study Prior Functional Status       General Date of Onset: 01/19/15 Type of Study: Bedside swallow evaluation Diet Prior to this Study: Regular;Thin liquids Temperature Spikes Noted: N/A Respiratory Status: Room air History of Recent Intubation: No Behavior/Cognition: Alert;Cooperative;Pleasant mood Oral Cavity - Dentition: Adequate natural  dentition/normal for age Self-Feeding Abilities: Able to feed self Patient Positioning: Upright in chair/Tumbleform Baseline Vocal Quality: Normal Volitional Cough: Strong Volitional Swallow: Able to elicit    Oral/Motor/Sensory Function Overall Oral Motor/Sensory Function: Appears within functional limits for tasks assessed Lingual Strength: Within Functional Limits   Ice Chips Ice chips: Not tested   Thin Liquid Thin Liquid: Within functional limits    Nectar Thick Nectar Thick Liquid: Not tested   Honey Thick Honey Thick Liquid: Not tested   Puree Puree: Within functional limits   Solid   GO    Solid: Within functional limits       Meredith PelStacie Harris Sauber 01/19/2015,12:32 PM

## 2015-01-19 NOTE — Progress Notes (Signed)
Subjective:  Overall doing fair Sitting up in chair Denies acute c/o Sons are in the room  Objective:  Vital signs in last 24 hours:  Temp:  [97.4 F (36.3 C)-98.5 F (36.9 C)] 97.4 F (36.3 C) (06/28 0057) Pulse Rate:  [67-149] 67 (06/28 0830) Resp:  [16] 16 (06/28 0830) BP: (85-103)/(51-60) 102/59 mmHg (06/28 0830) SpO2:  [80 %-100 %] 100 % (06/28 0830) Weight:  [59.512 kg (131 lb 3.2 oz)] 59.512 kg (131 lb 3.2 oz) (06/28 0606)  Weight change: 1.86 kg (4 lb 1.6 oz) Filed Weights   01/17/15 0608 01/18/15 0418 01/19/15 0606  Weight: 57.017 kg (125 lb 11.2 oz) 57.652 kg (127 lb 1.6 oz) 59.512 kg (131 lb 3.2 oz)    Intake/Output: I/O last 3 completed shifts: In: 120 [P.O.:120] Out: 550 [Urine:550]   Intake/Output this shift:  Total I/O In: 240 [P.O.:240] Out: 200 [Urine:200]  Physical Exam: General: Frail appearing  Head: Normocephalic, atraumatic. Moist oral mucosal membranes  Eyes: Anicteric  Neck: Supple, trachea midline  Lungs:  Bilateral rhonchi, normal effort, deceased breath sounds at bases  Heart: S1S2, no rubs  Abdomen:  Soft, nontender, BS present   Extremities:  1+ RLE edema, left leg wrapped  Neurologic: Awake, follows commands, hard of hearing  Skin:        Basic Metabolic Panel:  Recent Labs Lab 01/15/15 0451 01/16/15 0437 01/17/15 0536 01/18/15 0616 01/19/15 0432  NA 135 136 133* 132* 130*  K 3.7 4.2 4.3 4.5 5.0  CL 96* 98* 95* 95* 95*  CO2 29 29 26 25 27   GLUCOSE 104* 90 84 83 84  BUN 74* 78* 93* 95* 97*  CREATININE 1.56* 1.58* 2.17* 1.90* 1.94*  CALCIUM 7.9* 8.3* 8.2* 8.4* 8.4*    Liver Function Tests: No results for input(s): AST, ALT, ALKPHOS, BILITOT, PROT, ALBUMIN in the last 168 hours. No results for input(s): LIPASE, AMYLASE in the last 168 hours. No results for input(s): AMMONIA in the last 168 hours.  CBC:  Recent Labs Lab 01/15/15 0451 01/16/15 0437 01/17/15 0536 01/18/15 0616 01/19/15 0432  WBC 5.2 5.4 6.2 5.8  6.1  NEUTROABS 3.6 3.8 5.2 4.5 5.5  HGB 8.9* 8.7* 8.6* 8.3* 8.2*  HCT 27.4* 26.8* 27.0* 25.8* 25.9*  MCV 96.7 96.3 97.3 95.5 95.8  PLT 56* 60* 66* 60* 63*    Cardiac Enzymes: No results for input(s): CKTOTAL, CKMB, CKMBINDEX, TROPONINI in the last 168 hours.  BNP: Invalid input(s): POCBNP  CBG: No results for input(s): GLUCAP in the last 168 hours.  Microbiology: Results for orders placed or performed during the hospital encounter of 01/10/15  Wound culture     Status: None   Collection Time: 01/10/15  8:36 PM  Result Value Ref Range Status   Specimen Description LEG LEFT LEG  Final   Special Requests Normal  Final   Gram Stain   Final    RARE WBC SEEN RARE GRAM POSITIVE COCCI RARE GRAM NEGATIVE RODS    Culture   Final    MODERATE GROWTH PROTEUS MIRABILIS LIGHT GROWTH ESCHERICHIA COLI LIGHT GROWTH METHICILLIN RESISTANT STAPHYLOCOCCUS AUREUS LIGHT GROWTH GROUP B STREP(S.AGALACTIAE)ISOLATED Virtually 100% of S. agalactiae (Group B) strains are susceptible to Penicillin.  For Penicillin-allergic patients, Erythromycin (85-95% sensitive) and Clindamycin (80% sensitive) are drugs of choice. Contact microbiology lab to request sensitivities if  needed within 7 days.    Report Status 01/14/2015 FINAL  Final   Organism ID, Bacteria PROTEUS MIRABILIS  Final   Organism ID,  Bacteria ESCHERICHIA COLI  Final   Organism ID, Bacteria METHICILLIN RESISTANT STAPHYLOCOCCUS AUREUS  Final      Susceptibility   Escherichia coli - MIC*    AMPICILLIN >=32 RESISTANT Resistant     CEFAZOLIN <=4 SENSITIVE Sensitive     CEFTRIAXONE <=1 SENSITIVE Sensitive     CIPROFLOXACIN >=4 RESISTANT Resistant     GENTAMICIN >=16 RESISTANT Resistant     IMIPENEM <=0.25 SENSITIVE Sensitive     TRIMETH/SULFA >=320 RESISTANT Resistant     CEFOXITIN <=4 SENSITIVE Sensitive     PIP/TAZO Value in next row Sensitive      SENSITIVE<=4    * LIGHT GROWTH ESCHERICHIA COLI   Proteus mirabilis - MIC*    AMPICILLIN  Value in next row Sensitive      SENSITIVE<=4    CEFAZOLIN Value in next row Sensitive      SENSITIVE<=4    CEFTRIAXONE Value in next row Sensitive      SENSITIVE<=4    CIPROFLOXACIN Value in next row Sensitive      SENSITIVE<=4    GENTAMICIN Value in next row Sensitive      SENSITIVE<=4    IMIPENEM Value in next row Sensitive      SENSITIVE<=4    TRIMETH/SULFA Value in next row Sensitive      SENSITIVE<=4    CEFOXITIN Value in next row Sensitive      SENSITIVE<=4    PIP/TAZO Value in next row Sensitive      SENSITIVE<=4    * MODERATE GROWTH PROTEUS MIRABILIS   Methicillin resistant staphylococcus aureus - MIC*    CIPROFLOXACIN Value in next row Resistant      SENSITIVE<=4    GENTAMICIN Value in next row Sensitive      SENSITIVE<=4    OXACILLIN Value in next row Resistant      SENSITIVE<=4    VANCOMYCIN Value in next row Sensitive      SENSITIVE<=4    TRIMETH/SULFA Value in next row Sensitive      SENSITIVE<=4    CEFOXITIN SCREEN Value in next row Resistant      POSITIVECEFOXITIN SCREEN - This test may be used to predict mecA-mediated oxacillin resistance, and it is based on the cefoxitin disk screen test.  The cefoxitin screen and oxacillin work in combination to determine the final interpretation reported for oxacillin.     Inducible Clindamycin Value in next row Resistant      POSITIVEINDUCIBLE CLINDAMYCIN RESISTANCE - A positive ICR test is indicative of inducible resistance to macrolides, lincosamides, and type B streptogramin.  This isolate is presumed to be resistant to Clindamycin, however, Clindamycin may still be effective in some patients.     TETRACYCLINE Value in next row Sensitive      SENSITIVE<=1    CLINDAMYCIN Value in next row Resistant      RESISTANT>=8    LEVOFLOXACIN Value in next row Resistant      RESISTANT>=8    * LIGHT GROWTH METHICILLIN RESISTANT STAPHYLOCOCCUS AUREUS  C difficile quick scan w PCR reflex ([facility] only)     Status: None   Collection  Time: 01/17/15  5:34 PM  Result Value Ref Range Status   C Diff antigen NEGATIVE  Final   C Diff toxin NEGATIVE  Final   C Diff interpretation Negative for C. difficile  Final    Coagulation Studies:  Recent Labs  01/17/15 0536 01/18/15 0616 01/19/15 0432  LABPROT 22.7* 22.8* 25.2*  INR 1.98 2.00 2.27  Urinalysis: No results for input(s): COLORURINE, LABSPEC, PHURINE, GLUCOSEU, HGBUR, BILIRUBINUR, KETONESUR, PROTEINUR, UROBILINOGEN, NITRITE, LEUKOCYTESUR in the last 72 hours.  Invalid input(s): APPERANCEUR    Imaging: Dg Chest 1 View  01/18/2015   CLINICAL DATA:  Lung crackles.  EXAM: CHEST  1 VIEW  COMPARISON:  01/13/2015  FINDINGS: Bilateral pleural effusions, right greater than left, slightly increased since prior study. There is cardiomegaly. Increasing bibasilar opacities, likely atelectasis. Right perihilar atelectasis or infiltrate.  IMPRESSION: Mildly increased bilateral pleural effusions and airspace opacities, both greater on the right.   Electronically Signed   By: Rolm Baptise M.D.   On: 01/18/2015 13:27     Medications:     . allopurinol  100 mg Oral Daily  . cephALEXin  500 mg Oral 3 times per day  . collagenase   Topical Daily  . doxycycline  100 mg Oral Q12H  . feeding supplement (ENSURE ENLIVE)  237 mL Oral BID BM  . ferrous sulfate  325 mg Oral BID WC  . metoprolol tartrate  12.5 mg Oral BID  . mometasone-formoterol  2 puff Inhalation BID  . montelukast  10 mg Oral Daily  . sodium chloride  3 mL Intravenous Q12H  . warfarin  2 mg Oral q1800   acetaminophen **OR** acetaminophen, albuterol, alum & mag hydroxide-simeth, HYDROcodone-acetaminophen, ondansetron (ZOFRAN) IV, senna-docusate  Assessment/ Plan:  81 y.o. female with a PMHx of asthma, COPD, atrial fibrillation, gout, hypertension, chronic kidney disease stage III, chronic systolic heart failure, mitral regurgitation, who was admitted to [facility] on 01/10/2015 for evaluation of left lower extremity  wound.   1. Acute renal failure due to ATN/CKD stage III baseline egfr 48. The patient was on multiple diuretic's including Lasix and spironolactone at the time of admission. s Cr is stabilizing but BUN remains critically high Doxycycline can sometimes interfere with the assay Continue to monitor  2. LE edema - agree with d/c iv fluids - CXR- b/l pleural effusions - Use of diuretics is limited as patient's BP is low normal   LOS: 9 Siobhan Zaro 6/28/201611:04 AM

## 2015-01-19 NOTE — Progress Notes (Signed)
ANTICOAGULATION CONSULT NOTE   Pharmacy Consult for warfarin dosing Indication: atrial fibrillation  Allergies  Allergen Reactions  . Latex Rash  . Levofloxacin Rash  . Sulfa Antibiotics Rash    Patient Measurements: Height: 5\' 4"  (162.6 cm) Weight: 131 lb 3.2 oz (59.512 kg) IBW/kg (Calculated) : 54.7 Heparin Dosing Weight: n/a  Vital Signs: Temp: 97.4 F (36.3 C) (06/28 0057) Temp Source: Oral (06/28 0057) BP: 102/59 mmHg (06/28 0830) Pulse Rate: 67 (06/28 0830)  Labs:  Recent Labs  01/17/15 0536 01/18/15 0616 01/19/15 0432  HGB 8.6* 8.3* 8.2*  HCT 27.0* 25.8* 25.9*  PLT 66* 60* 63*  LABPROT 22.7* 22.8* 25.2*  INR 1.98 2.00 2.27  CREATININE 2.17* 1.90*  --     Estimated Creatinine Clearance: 18 mL/min (by C-G formula based on Cr of 1.9).   Medical History: Past Medical History  Diagnosis Date  . Asthma   . COPD (chronic obstructive pulmonary disease)   . A-fib   . Gout   . Essential hypertension   . Cardiomyopathy   . Chronic kidney disease   . Chronic systolic congestive heart failure   . Severe mitral regurgitation   . Mild aortic regurgitation   . Severe tricuspid regurgitation     Home med:  Coumadin 2mg  daily.  Assessment: INR therapeutic  @ 2.27  Goal of Therapy:  INR 2-3   Plan:   Will continue warfarin 2 mg PO daily.  Will check INR in the morning and reassess therapy.   Demetrius Charityeldrin D. Makensey Rego, PharmD Clinical Pharmacist 01/19/2015

## 2015-01-19 NOTE — Progress Notes (Signed)
SATURATION QUALIFICATIONS: (This note is used to comply with regulatory documentation for home oxygen)  Patient Saturations on Room Air at Rest = 78%    

## 2015-01-20 ENCOUNTER — Observation Stay
Admission: EM | Admit: 2015-01-20 | Discharge: 2015-01-21 | Disposition: A | Discharge status: Skilled Nursing Facility | Payer: Medicare Other | Attending: Internal Medicine | Admitting: Internal Medicine

## 2015-01-20 ENCOUNTER — Encounter: Payer: Self-pay | Admitting: *Deleted

## 2015-01-20 DIAGNOSIS — Z90710 Acquired absence of both cervix and uterus: Secondary | ICD-10-CM | POA: Diagnosis not present

## 2015-01-20 DIAGNOSIS — N3000 Acute cystitis without hematuria: Secondary | ICD-10-CM | POA: Insufficient documentation

## 2015-01-20 DIAGNOSIS — I071 Rheumatic tricuspid insufficiency: Secondary | ICD-10-CM | POA: Insufficient documentation

## 2015-01-20 DIAGNOSIS — N390 Urinary tract infection, site not specified: Secondary | ICD-10-CM | POA: Diagnosis not present

## 2015-01-20 DIAGNOSIS — Z87891 Personal history of nicotine dependence: Secondary | ICD-10-CM | POA: Insufficient documentation

## 2015-01-20 DIAGNOSIS — N183 Chronic kidney disease, stage 3 (moderate): Secondary | ICD-10-CM | POA: Diagnosis not present

## 2015-01-20 DIAGNOSIS — I482 Chronic atrial fibrillation: Secondary | ICD-10-CM | POA: Insufficient documentation

## 2015-01-20 DIAGNOSIS — R778 Other specified abnormalities of plasma proteins: Secondary | ICD-10-CM

## 2015-01-20 DIAGNOSIS — I129 Hypertensive chronic kidney disease with stage 1 through stage 4 chronic kidney disease, or unspecified chronic kidney disease: Secondary | ICD-10-CM | POA: Diagnosis not present

## 2015-01-20 DIAGNOSIS — R531 Weakness: Secondary | ICD-10-CM | POA: Diagnosis present

## 2015-01-20 DIAGNOSIS — Z7901 Long term (current) use of anticoagulants: Secondary | ICD-10-CM | POA: Diagnosis not present

## 2015-01-20 DIAGNOSIS — R748 Abnormal levels of other serum enzymes: Secondary | ICD-10-CM | POA: Insufficient documentation

## 2015-01-20 DIAGNOSIS — Z825 Family history of asthma and other chronic lower respiratory diseases: Secondary | ICD-10-CM | POA: Diagnosis not present

## 2015-01-20 DIAGNOSIS — J449 Chronic obstructive pulmonary disease, unspecified: Secondary | ICD-10-CM | POA: Insufficient documentation

## 2015-01-20 DIAGNOSIS — E875 Hyperkalemia: Secondary | ICD-10-CM | POA: Diagnosis present

## 2015-01-20 DIAGNOSIS — I340 Nonrheumatic mitral (valve) insufficiency: Secondary | ICD-10-CM | POA: Insufficient documentation

## 2015-01-20 DIAGNOSIS — R7989 Other specified abnormal findings of blood chemistry: Secondary | ICD-10-CM | POA: Diagnosis present

## 2015-01-20 DIAGNOSIS — R5383 Other fatigue: Secondary | ICD-10-CM | POA: Insufficient documentation

## 2015-01-20 DIAGNOSIS — N179 Acute kidney failure, unspecified: Secondary | ICD-10-CM | POA: Insufficient documentation

## 2015-01-20 DIAGNOSIS — Z91048 Other nonmedicinal substance allergy status: Secondary | ICD-10-CM | POA: Insufficient documentation

## 2015-01-20 DIAGNOSIS — M109 Gout, unspecified: Secondary | ICD-10-CM | POA: Diagnosis not present

## 2015-01-20 DIAGNOSIS — I5022 Chronic systolic (congestive) heart failure: Secondary | ICD-10-CM | POA: Insufficient documentation

## 2015-01-20 DIAGNOSIS — Z91040 Latex allergy status: Secondary | ICD-10-CM | POA: Diagnosis not present

## 2015-01-20 DIAGNOSIS — N3090 Cystitis, unspecified without hematuria: Secondary | ICD-10-CM

## 2015-01-20 DIAGNOSIS — Z882 Allergy status to sulfonamides status: Secondary | ICD-10-CM | POA: Insufficient documentation

## 2015-01-20 DIAGNOSIS — I351 Nonrheumatic aortic (valve) insufficiency: Secondary | ICD-10-CM | POA: Insufficient documentation

## 2015-01-20 DIAGNOSIS — Z801 Family history of malignant neoplasm of trachea, bronchus and lung: Secondary | ICD-10-CM | POA: Insufficient documentation

## 2015-01-20 DIAGNOSIS — Z794 Long term (current) use of insulin: Secondary | ICD-10-CM | POA: Insufficient documentation

## 2015-01-20 LAB — URINALYSIS COMPLETE WITH MICROSCOPIC (ARMC ONLY)
Bacteria, UA: NONE SEEN
Bilirubin Urine: NEGATIVE
Glucose, UA: NEGATIVE mg/dL
Ketones, ur: NEGATIVE mg/dL
Nitrite: NEGATIVE
PH: 5 (ref 5.0–8.0)
PROTEIN: 30 mg/dL — AB
Specific Gravity, Urine: 1.011 (ref 1.005–1.030)

## 2015-01-20 LAB — CBC WITH DIFFERENTIAL/PLATELET
BASOS PCT: 0 % (ref 0–1)
Band Neutrophils: 3 % (ref 0–10)
Basophils Absolute: 0 10*3/uL (ref 0.0–0.1)
Blasts: 0 %
EOS PCT: 3 % (ref 0–5)
Eosinophils Absolute: 0.3 10*3/uL (ref 0.0–0.7)
HCT: 27.1 % — ABNORMAL LOW (ref 35.0–47.0)
Hemoglobin: 8.5 g/dL — ABNORMAL LOW (ref 12.0–16.0)
LYMPHS PCT: 14 % (ref 12–46)
Lymphs Abs: 1.5 10*3/uL (ref 0.7–4.0)
MCH: 30.4 pg (ref 26.0–34.0)
MCHC: 31.4 g/dL — ABNORMAL LOW (ref 32.0–36.0)
MCV: 96.8 fL (ref 80.0–100.0)
MYELOCYTES: 0 %
Metamyelocytes Relative: 0 %
Monocytes Absolute: 0 10*3/uL — ABNORMAL LOW (ref 0.1–1.0)
Monocytes Relative: 0 % — ABNORMAL LOW (ref 3–12)
NRBC: 6 /100{WBCs} — AB
Neutro Abs: 8.7 10*3/uL — ABNORMAL HIGH (ref 1.7–7.7)
Neutrophils Relative %: 80 % — ABNORMAL HIGH (ref 43–77)
OTHER: 0 %
Platelets: 88 10*3/uL — ABNORMAL LOW (ref 150–440)
Promyelocytes Absolute: 0 %
RBC: 2.8 MIL/uL — ABNORMAL LOW (ref 3.80–5.20)
RDW: 19 % — ABNORMAL HIGH (ref 11.5–14.5)
WBC: 10.5 10*3/uL (ref 3.6–11.0)

## 2015-01-20 LAB — PROTIME-INR
INR: 3.43
PROTHROMBIN TIME: 34.6 s — AB (ref 11.4–15.0)

## 2015-01-20 LAB — COMPREHENSIVE METABOLIC PANEL
ALT: 45 U/L (ref 14–54)
AST: 49 U/L — AB (ref 15–41)
Albumin: 3.1 g/dL — ABNORMAL LOW (ref 3.5–5.0)
Alkaline Phosphatase: 109 U/L (ref 38–126)
Anion gap: 10 (ref 5–15)
BILIRUBIN TOTAL: 1 mg/dL (ref 0.3–1.2)
BUN: 97 mg/dL — ABNORMAL HIGH (ref 6–20)
CHLORIDE: 95 mmol/L — AB (ref 101–111)
CO2: 25 mmol/L (ref 22–32)
Calcium: 8.8 mg/dL — ABNORMAL LOW (ref 8.9–10.3)
Creatinine, Ser: 1.93 mg/dL — ABNORMAL HIGH (ref 0.44–1.00)
GFR calc Af Amer: 26 mL/min — ABNORMAL LOW (ref >60)
GFR calc non Af Amer: 22 mL/min — ABNORMAL LOW (ref >60)
Glucose, Bld: 104 mg/dL — ABNORMAL HIGH (ref 65–99)
Potassium: 5.7 mmol/L — ABNORMAL HIGH (ref 3.5–5.1)
SODIUM: 130 mmol/L — AB (ref 135–145)
Total Protein: 6.1 g/dL — ABNORMAL LOW (ref 6.5–8.1)

## 2015-01-20 LAB — TROPONIN I: Troponin I: 0.06 ng/mL — ABNORMAL HIGH (ref <0.031)

## 2015-01-20 MED ORDER — CEFTRIAXONE SODIUM 1 G IJ SOLR
INTRAMUSCULAR | Status: AC
  Filled 2015-01-20: qty 10

## 2015-01-20 MED ORDER — SODIUM CHLORIDE 0.9 % IJ SOLN
3.0000 mL | Freq: Two times a day (BID) | INTRAMUSCULAR | Status: DC
  Administered 2015-01-20 – 2015-01-21 (×2): 3 mL via INTRAVENOUS

## 2015-01-20 MED ORDER — SODIUM CHLORIDE 0.9 % IV SOLN
Freq: Once | INTRAVENOUS | Status: AC
  Administered 2015-01-20: 18:00:00 via INTRAVENOUS

## 2015-01-20 MED ORDER — MONTELUKAST SODIUM 10 MG PO TABS
10.0000 mg | ORAL_TABLET | Freq: Every day | ORAL | Status: DC
  Administered 2015-01-20: 10 mg via ORAL
  Filled 2015-01-20: qty 1

## 2015-01-20 MED ORDER — ONDANSETRON HCL 4 MG PO TABS: 4.0000 mg | ORAL_TABLET | Freq: Four times a day (QID) | ORAL | Status: DC | PRN

## 2015-01-20 MED ORDER — ACETAMINOPHEN 325 MG PO TABS: 650.0000 mg | ORAL_TABLET | Freq: Four times a day (QID) | ORAL | Status: DC | PRN

## 2015-01-20 MED ORDER — MORPHINE SULFATE 2 MG/ML IJ SOLN: 2.0000 mg | INTRAMUSCULAR | Status: DC | PRN

## 2015-01-20 MED ORDER — ALUM & MAG HYDROXIDE-SIMETH 200-200-20 MG/5ML PO SUSP: 30.0000 mL | Freq: Four times a day (QID) | ORAL | Status: DC | PRN

## 2015-01-20 MED ORDER — METOPROLOL TARTRATE 25 MG PO TABS
12.5000 mg | ORAL_TABLET | Freq: Two times a day (BID) | ORAL | Status: DC
  Administered 2015-01-21: 12.5 mg via ORAL
  Filled 2015-01-20: qty 1

## 2015-01-20 MED ORDER — CEFTRIAXONE SODIUM IN DEXTROSE 20 MG/ML IV SOLN
INTRAVENOUS | Status: AC
  Filled 2015-01-20: qty 50

## 2015-01-20 MED ORDER — ONDANSETRON HCL 4 MG/2ML IJ SOLN: 4.0000 mg | Freq: Four times a day (QID) | INTRAMUSCULAR | Status: DC | PRN

## 2015-01-20 MED ORDER — MOMETASONE FURO-FORMOTEROL FUM 100-5 MCG/ACT IN AERO
2.0000 | INHALATION_SPRAY | Freq: Two times a day (BID) | RESPIRATORY_TRACT | Status: DC
  Administered 2015-01-20 – 2015-01-21 (×2): 2 via RESPIRATORY_TRACT
  Filled 2015-01-20: qty 8.8

## 2015-01-20 MED ORDER — ACETAMINOPHEN 650 MG RE SUPP: 650.0000 mg | Freq: Four times a day (QID) | RECTAL | Status: DC | PRN

## 2015-01-20 MED ORDER — IPRATROPIUM-ALBUTEROL 0.5-2.5 (3) MG/3ML IN SOLN: 3.0000 mL | RESPIRATORY_TRACT | Status: DC | PRN

## 2015-01-20 MED ORDER — ALBUTEROL SULFATE (2.5 MG/3ML) 0.083% IN NEBU
3.0000 mL | INHALATION_SOLUTION | RESPIRATORY_TRACT | Status: DC | PRN
  Administered 2015-01-21: 3 mL via RESPIRATORY_TRACT
  Filled 2015-01-20: qty 3

## 2015-01-20 MED ORDER — CEFTRIAXONE SODIUM IN DEXTROSE 20 MG/ML IV SOLN
1.0000 g | Freq: Once | INTRAVENOUS | Status: AC
Start: 2015-01-20 — End: 2015-01-20
  Administered 2015-01-20: 19:00:00 via INTRAVENOUS

## 2015-01-20 MED ORDER — SODIUM CHLORIDE 0.9 % IV SOLN
INTRAVENOUS | Status: DC
  Administered 2015-01-20: 23:00:00 via INTRAVENOUS

## 2015-01-20 MED ORDER — DOXYCYCLINE HYCLATE 100 MG PO TABS
100.0000 mg | ORAL_TABLET | Freq: Two times a day (BID) | ORAL | Status: DC
  Administered 2015-01-20 – 2015-01-21 (×2): 100 mg via ORAL
  Filled 2015-01-20 (×2): qty 1

## 2015-01-20 MED ORDER — CEFTRIAXONE SODIUM IN DEXTROSE 20 MG/ML IV SOLN
1.0000 g | INTRAVENOUS | Status: DC
Start: 2015-01-21 — End: 2015-01-21
  Administered 2015-01-21: 1 g via INTRAVENOUS
  Filled 2015-01-20 (×2): qty 50

## 2015-01-20 MED ORDER — ALLOPURINOL 100 MG PO TABS
100.0000 mg | ORAL_TABLET | Freq: Every day | ORAL | Status: DC
  Administered 2015-01-20 – 2015-01-21 (×2): 100 mg via ORAL
  Filled 2015-01-20 (×2): qty 1

## 2015-01-20 MED ORDER — LORATADINE 10 MG PO TABS
10.0000 mg | ORAL_TABLET | Freq: Every day | ORAL | Status: DC
  Administered 2015-01-20 – 2015-01-21 (×2): 10 mg via ORAL
  Filled 2015-01-20 (×2): qty 1

## 2015-01-20 MED ORDER — FERROUS SULFATE 325 (65 FE) MG PO TABS
325.0000 mg | ORAL_TABLET | Freq: Two times a day (BID) | ORAL | Status: DC
  Administered 2015-01-21: 325 mg via ORAL
  Filled 2015-01-20: qty 1

## 2015-01-20 NOTE — ED Notes (Signed)
Pt brought in via ems from home with weakness.  Pt was discharged yesterday from [facility].  Pt has mrsa of left lower leg.  Pt alert on arrival to er.  Pt on home oxygen 3l all the time.  Family reports she could not stand at home today...just too weak.

## 2015-01-20 NOTE — H&P (Signed)
[facility] - [place] at [facility]   PATIENT NAME: Alicia Trujillo    MR#:  [id number]  DATE OF BIRTH:  July 24, 1925   DATE OF ADMISSION:  01/20/2015  PRIMARY CARE PHYSICIAN: Marisue Ivan, MD   REQUESTING/REFERRING PHYSICIAN: Mayford Knife  CHIEF COMPLAINT:   Chief Complaint  Patient presents with  . Fatigue   Weakness HISTORY OF PRESENT ILLNESS:  Alicia Trujillo  is a 81 y.o. female with a known history of COPD, chronic respiratory failure 3 of his has cannula, chronic kidney disease Baseline creatinine 1.9, A. fib on warfarin for anticoagulation presenting with weakness. She was recently discharged from the hospital 01/19/2015 discharge diagnosis acute on chronic kidney injury, wound infection. She was discharged with a course of doxycycline/Keflex for 7 days total duration. However while at home experienced extreme weakness to the point she was unable to ambulate successfully or transfer to chair. With the symptoms decided to present to Hospital further workup and evaluation. Patient difficult historian history aided from family members present at bedside. Denies any further symptomatology at this time. Emergency department course: noted to have hyperkalemia  PAST MEDICAL HISTORY:   Past Medical History  Diagnosis Date  . Asthma   . COPD (chronic obstructive pulmonary disease)   . A-fib   . Gout   . Essential hypertension   . Cardiomyopathy   . Chronic kidney disease   . Chronic systolic congestive heart failure   . Severe mitral regurgitation   . Mild aortic regurgitation   . Severe tricuspid regurgitation     PAST SURGICAL HISTORY:   Past Surgical History  Procedure Laterality Date  . Abdominal hysterectomy    . Bladder surgery      SOCIAL HISTORY:   History  Substance Use Topics  . Smoking status: Former Games developer  . Smokeless tobacco: Never Used  . Alcohol Use: No    FAMILY HISTORY:   Family History  Problem Relation Age of  Onset  . Asthma Father   . Lung cancer Brother     DRUG ALLERGIES:   Allergies  Allergen Reactions  . Latex Rash  . Levofloxacin Rash  . Sulfa Antibiotics Rash    REVIEW OF SYSTEMS:  REVIEW OF SYSTEMS:  CONSTITUTIONAL: Denies fevers, chills, positive fatigue, weakness.  EYES: Denies blurred vision, double vision, or eye pain.  EARS, NOSE, THROAT: Denies tinnitus, ear pain, hearing loss.  RESPIRATORY: denies cough, shortness of breath, wheezing  CARDIOVASCULAR: Denies chest pain, palpitations, edema.  GASTROINTESTINAL: Denies nausea, vomiting, diarrhea, abdominal pain.  GENITOURINARY: Denies dysuria, hematuria.  ENDOCRINE: Denies nocturia or thyroid problems. HEMATOLOGIC AND LYMPHATIC: Denies easy bruising or bleeding.  SKIN: Denies rash or lesions. Positive bruising MUSCULOSKELETAL: Denies pain in neck, back, shoulder, knees, hips, or further arthritic symptoms.  NEUROLOGIC: Denies paralysis, paresthesias.  PSYCHIATRIC: Denies anxiety or depressive symptoms. Otherwise full review of systems performed by me is negative.   MEDICATIONS AT HOME:   Prior to Admission medications   Medication Sig Start Date End Date Taking? Authorizing Provider  albuterol (PROVENTIL HFA;VENTOLIN HFA) 108 (90 BASE) MCG/ACT inhaler Inhale 2 puffs into the lungs every 4 (four) hours as needed for wheezing or shortness of breath.   Yes Historical Provider, MD  allopurinol (ZYLOPRIM) 100 MG tablet Take 100 mg by mouth daily.   Yes Historical Provider, MD  alum & mag hydroxide-simeth (MAALOX/MYLANTA) 200-200-20 MG/5ML suspension Take 30 mLs by mouth every 6 (six) hours as needed for indigestion or heartburn. 01/19/15  Yes Shaune Pollack,  MD  Calcium Carbonate-Vitamin D (CALCIUM-VITAMIN D) 500-200 MG-UNIT per tablet Take 1 tablet by mouth daily.   Yes Historical Provider, MD  cephALEXin (KEFLEX) 500 MG capsule Take 1 capsule (500 mg total) by mouth 3 (three) times daily. 01/19/15  Yes Shaune PollackQing Chen, MD  doxycycline  (VIBRA-TABS) 100 MG tablet Take 1 tablet (100 mg total) by mouth 2 (two) times daily. 01/19/15  Yes Shaune PollackQing Chen, MD  ferrous sulfate 325 (65 FE) MG tablet Take 1 tablet (325 mg total) by mouth 2 (two) times daily with a meal. 01/19/15  Yes Shaune PollackQing Chen, MD  fexofenadine (ALLEGRA) 180 MG tablet Take 180 mg by mouth daily.   Yes Historical Provider, MD  Fluticasone-Salmeterol (ADVAIR) 250-50 MCG/DOSE AEPB Inhale 1 puff into the lungs 2 (two) times daily.   Yes Historical Provider, MD  metoprolol tartrate (LOPRESSOR) 25 MG tablet Take 12.5 mg by mouth 2 (two) times daily.   Yes Historical Provider, MD  montelukast (SINGULAIR) 10 MG tablet Take 10 mg by mouth at bedtime.   Yes Historical Provider, MD  Multiple Vitamins-Minerals (MULTIVITAMIN PO) Take 1 tablet by mouth daily.   Yes Historical Provider, MD  warfarin (COUMADIN) 2 MG tablet Take 2 mg by mouth at bedtime.   Yes Historical Provider, MD      VITAL SIGNS:  Blood pressure 101/60, pulse 64, temperature 98.6 F (37 C), temperature source Oral, resp. rate 15, height 5\' 2"  (1.575 m), weight 110 lb (49.896 kg), SpO2 95 %.  PHYSICAL EXAMINATION:  VITAL SIGNS: Filed Vitals:   01/20/15 2000  BP: 101/60  Pulse:   Temp:   Resp: 15   GENERAL:81 y.o.female currently in no acute distress. Extremely difficult of hearing HEAD: Normocephalic, atraumatic.  EYES: Pupils equal, round, reactive to light. Extraocular muscles intact. No scleral icterus.  MOUTH: Moist mucosal membrane. Dentition intact. No abscess noted.  EAR, NOSE, THROAT: Clear without exudates. No external lesions.  NECK: Supple. No thyromegaly. No nodules. No JVD.  PULMONARY: positive wheeze without rails or rhonci. No use of accessory muscles, Good respiratory effort. good air entry bilaterally CHEST: Nontender to palpation.  CARDIOVASCULAR: S1 and S2. Irregular rate and rhythm. No murmurs, rubs, or gallops. No edema. Pedal pulses 2+ bilaterally.  GASTROINTESTINAL: Soft, nontender,  nondistended. No masses. Positive bowel sounds. No hepatosplenomegaly.  MUSCULOSKELETAL: No swelling, clubbing, or edema. Range of motion full in all extremities.  NEUROLOGIC: Cranial nerves II through XII are intact. No gross focal neurological deficits. Difficult to fully assess given patient's hard of hearing, difficulty following exams SKIN: Multiple areas of ecchymosis, left lower extremity wound well dressed without surrounding erythema, No further ulceration, lesions, rashes, or cyanosis. Skin warm and dry. Turgor intact.  PSYCHIATRIC: Difficult to fully assess given patient's hard of hearing, awake alert oriented 3, insight and judgment appear to be intact   LABORATORY PANEL:   CBC  Recent Labs Lab 01/20/15 1651  WBC 10.5  HGB 8.5*  HCT 27.1*  PLT 88*   ------------------------------------------------------------------------------------------------------------------  Chemistries   Recent Labs Lab 01/20/15 1651  NA 130*  K 5.7*  CL 95*  CO2 25  GLUCOSE 104*  BUN 97*  CREATININE 1.93*  CALCIUM 8.8*  AST 49*  ALT 45  ALKPHOS 109  BILITOT 1.0   ------------------------------------------------------------------------------------------------------------------  Cardiac Enzymes  Recent Labs Lab 01/20/15 1651  TROPONINI 0.06*   ------------------------------------------------------------------------------------------------------------------  RADIOLOGY:  No results found.  EKG:   Orders placed or performed during the hospital encounter of 01/10/15  . EKG 12-Lead  .  EKG 12-Lead  . EKG    IMPRESSION AND PLAN:   81 year old Caucasian female history of chronic kidney disease baseline creatinine around 1.9, A. fib warfarin for anticoagulation presenting with generalized weakness.  1. Hyperkalemia: No evidence of EKG changes we'll provide gentle IV fluid hydration and recheck BMP if remains elevated will need further treatment 2. UTI, site unspecified:  Ceftriaxone for antibiotics coverage follow urinalysis and urine culture 3. Generalized weakness: Physical therapy evaluation will likely need placement 4. Left lower extremity wound infection: Currently on course of doxycycline and Keflex total 7 day course, she is on day 1, hold Keflex while she is on ceftriaxone continue doxycycline, consult wound care 5. Chronic atrial fibrillation: Check INR, resume warfarin therapy 6. Venous thromboembolism prophylactic: Therapeutic warfarin    All the records are reviewed and case discussed with ED provider. Management plans discussed with the patient, family and they are in agreement.  CODE STATUS: Full  TOTAL TIME TAKING CARE OF THIS PATIENT: 45 minutes.    Hower,  Mardi Mainland.D on 01/20/2015 at 8:48 PM  Between 7am to 6pm - Pager - [phone]  After 6pm: House Pager: - [phone]  [facility]  Office  [phone]  CC: Primary care physician; Marisue Ivan, MD

## 2015-01-20 NOTE — ED Notes (Signed)
Patient placed on bedpan.

## 2015-01-20 NOTE — ED Provider Notes (Signed)
[facility] Emergency Department Provider Note     Time seen: ----------------------------------------- 4:19 PM on 01/20/2015 -----------------------------------------    I have reviewed the triage vital signs and the nursing notes.   HISTORY  Chief Complaint No chief complaint on file.    HPI Alicia Trujillo is a 81 y.o. female brought the ER by EMS for weakness. Patient reportedly could not get up today. Was just discharged from the hospital yesterday according to reportfor left lower extremity wound, atrial fibrillation, chronic systolic congestive heart, kidney failure, hypertension COPD and gout. Patient just describes weakness, denies any focal complaint at this time, nothing makes it better or worse.   Past Medical History  Diagnosis Date  . Asthma   . COPD (chronic obstructive pulmonary disease)   . A-fib   . Gout   . Essential hypertension   . Cardiomyopathy   . Chronic kidney disease   . Chronic systolic congestive heart failure   . Severe mitral regurgitation   . Mild aortic regurgitation   . Severe tricuspid regurgitation     Patient Active Problem List   Diagnosis Date Noted  . Wound of left lower extremity 01/10/2015  . Atrial fibrillation 01/10/2015  . Chronic systolic congestive heart failure 01/10/2015  . Acute on chronic kidney failure 01/10/2015  . Chronic kidney disease, stage III (moderate) 01/10/2015  . Essential hypertension 01/10/2015  . COPD (chronic obstructive pulmonary disease) 01/10/2015  . Elevated INR 01/10/2015  . Gout 01/10/2015    Past Surgical History  Procedure Laterality Date  . Abdominal hysterectomy    . Bladder surgery      Allergies Latex; Levofloxacin; and Sulfa antibiotics  Social History History  Substance Use Topics  . Smoking status: Former Games developer  . Smokeless tobacco: Never Used  . Alcohol Use: No    Review of Systems Constitutional: Negative for fever. Eyes: Negative for  visual changes. ENT: Negative for sore throat. Cardiovascular: Negative chest pain Respiratory: Negative for shortness of breath. Gastrointestinal: Negative for abdominal pain, vomiting and diarrhea. Genitourinary: Negative for dysuria. Musculoskeletal: Negative for back pain. Skin: Negative for rash. Positive for left lower extremity wound Neurological: Negative for headaches, positive for diffuse weakness  10-point ROS otherwise negative.  ____________________________________________   PHYSICAL EXAM:  VITAL SIGNS: ED Triage Vitals  Enc Vitals Group     BP --      Pulse --      Resp --      Temp --      Temp src --      SpO2 --      Weight --      Height --      Head Cir --      Peak Flow --      Pain Score --      Pain Loc --      Pain Edu? --      Excl. in GC? --     Constitutional: Alert. Well appearing and in no distress. Eyes: Conjunctivae are normal. PERRL. Normal extraocular movements. ENT   Head: Normocephalic and atraumatic.   Nose: No congestion/rhinnorhea.   Mouth/Throat: Mucous membranes are moist.   Neck: No stridor. Hematological/Lymphatic/Immunilogical: No cervical lymphadenopathy. Cardiovascular: Normal rate, regular rhythm. Normal and symmetric distal pulses are present in all extremities. No murmurs, rubs, or gallops. Respiratory: Normal respiratory effort without tachypnea nor retractions. Breath sounds are clear and equal bilaterally. No wheezes/rales/rhonchi. Gastrointestinal: Soft and nontender. No distention. No abdominal bruits. There is no  CVA tenderness. Musculoskeletal: Nontender with normal range of motion in all extremities. No joint effusions.  No lower extremity tenderness nor edema. Neurologic:  Normal speech and language. Generalized weakness, nothing focal Skin:  Healing wound and cellulitis in the left lower extremity. No significant edema  ____________________________________________  EKG: Interpreted by me. Normal  sinus rhythm with premature supraventricular complexes, PVCs, left axis deviation, left bundle branch block. Rate is 87 bpm  ____________________________________________  ED COURSE:  Pertinent labs & imaging results that were available during my care of the patient were reviewed by me and considered in my medical decision making (see chart for details).  we'll recheck basic labs, EKG. ____________________________________________    LABS (pertinent positives/negatives)  Labs Reviewed  CBC WITH DIFFERENTIAL/PLATELET - Abnormal; Notable for the following:    RBC 2.80 (*)    Hemoglobin 8.5 (*)    HCT 27.1 (*)    MCHC 31.4 (*)    RDW 19.0 (*)    Platelets 88 (*)    Neutrophils Relative % 80 (*)    Monocytes Relative 0 (*)    nRBC 6 (*)    Neutro Abs 8.7 (*)    Monocytes Absolute 0.0 (*)    All other components within normal limits  COMPREHENSIVE METABOLIC PANEL - Abnormal; Notable for the following:    Sodium 130 (*)    Potassium 5.7 (*)    Chloride 95 (*)    Glucose, Bld 104 (*)    BUN 97 (*)    Creatinine, Ser 1.93 (*)    Calcium 8.8 (*)    Total Protein 6.1 (*)    Albumin 3.1 (*)    AST 49 (*)    GFR calc non Af Amer 22 (*)    GFR calc Af Amer 26 (*)    All other components within normal limits  TROPONIN I - Abnormal; Notable for the following:    Troponin I 0.06 (*)    All other components within normal limits  URINALYSIS COMPLETEWITH MICROSCOPIC ([facility] ONLY) - Abnormal; Notable for the following:    Color, Urine YELLOW (*)    APPearance CLOUDY (*)    Hgb urine dipstick 3+ (*)    Protein, ur 30 (*)    Leukocytes, UA 2+ (*)    Squamous Epithelial / LPF 6-30 (*)    All other components within normal limits  PROTIME-INR     FINAL ASSESSMENT AND PLAN  generalized weakness, UTI, hyperkalemia, elevated troponin  Plan:patient with labs as dictated above.no acute findings were identified on EKG. Family is adamant that she cannot go home. Will discuss with the  hospitalist and turns are admission or placement to a skilled rehabilitation facility. She is given saline to help improve her high potassium and her high creatinine. She's also given Rocephin to treat her urine infection.   Emily FilbertWilliams, Jonathan E, MD   Emily FilbertJonathan E Williams, MD 01/20/15 Ebony [id number]

## 2015-01-20 NOTE — ED Notes (Signed)
Family at bedside.  nsr on monitor with occ pvc.  No chest pain.  Iv fluids infusing.

## 2015-01-20 NOTE — ED Notes (Signed)
[facility]

## 2015-01-20 NOTE — ED Notes (Signed)
Family reports they tried to take pt to bathroom and was just too weak to stand.  Pt brought in via ems from home.  md in with pt on arrival to er.  Pt alert on arrival.  Pt has a bandage on left lower leg.   Pt has mrsa to left lower leg.  Pt denies chest pain   No sob.

## 2015-01-21 LAB — PROTIME-INR
INR: 3.46
Prothrombin Time: 34.8 seconds — ABNORMAL HIGH (ref 11.4–15.0)

## 2015-01-21 LAB — BASIC METABOLIC PANEL
Anion gap: 10 (ref 5–15)
BUN: 94 mg/dL — ABNORMAL HIGH (ref 6–20)
CO2: 26 mmol/L (ref 22–32)
Calcium: 8.9 mg/dL (ref 8.9–10.3)
Chloride: 98 mmol/L — ABNORMAL LOW (ref 101–111)
Creatinine, Ser: 1.89 mg/dL — ABNORMAL HIGH (ref 0.44–1.00)
GFR calc Af Amer: 26 mL/min — ABNORMAL LOW (ref >60)
GFR calc non Af Amer: 23 mL/min — ABNORMAL LOW (ref >60)
Glucose, Bld: 102 mg/dL — ABNORMAL HIGH (ref 65–99)
Potassium: 5.4 mmol/L — ABNORMAL HIGH (ref 3.5–5.1)
Sodium: 134 mmol/L — ABNORMAL LOW (ref 135–145)

## 2015-01-21 LAB — CBC
HCT: 26.6 % — ABNORMAL LOW (ref 35.0–47.0)
Hemoglobin: 8.5 g/dL — ABNORMAL LOW (ref 12.0–16.0)
MCH: 30.9 pg (ref 26.0–34.0)
MCHC: 31.8 g/dL — AB (ref 32.0–36.0)
MCV: 97.1 fL (ref 80.0–100.0)
Platelets: 66 10*3/uL — ABNORMAL LOW (ref 150–440)
RBC: 2.74 MIL/uL — ABNORMAL LOW (ref 3.80–5.20)
RDW: 19.4 % — AB (ref 11.5–14.5)
WBC: 7.8 10*3/uL (ref 3.6–11.0)

## 2015-01-21 LAB — POTASSIUM
Potassium: 5.4 mmol/L — ABNORMAL HIGH (ref 3.5–5.1)
Potassium: 4.5 mmol/L (ref 3.5–5.1)

## 2015-01-21 LAB — TROPONIN I
Troponin I: 0.06 ng/mL — ABNORMAL HIGH (ref <0.031)
Troponin I: 0.06 ng/mL — ABNORMAL HIGH (ref <0.031)
Troponin I: 0.06 ng/mL — ABNORMAL HIGH (ref <0.031)

## 2015-01-21 MED ORDER — DEXTROSE 50 % IV SOLN
25.0000 mL | Freq: Once | INTRAVENOUS | Status: AC
  Administered 2015-01-21: 25 mL via INTRAVENOUS
  Filled 2015-01-21: qty 50

## 2015-01-21 MED ORDER — CEFTRIAXONE SODIUM IN DEXTROSE 20 MG/ML IV SOLN: 1.0000 g | INTRAVENOUS | Status: DC

## 2015-01-21 MED ORDER — SODIUM POLYSTYRENE SULFONATE 15 GM/60ML PO SUSP
30.0000 g | Freq: Once | ORAL | Status: AC
  Administered 2015-01-21: 30 g via ORAL
  Filled 2015-01-21: qty 120

## 2015-01-21 MED ORDER — INSULIN ASPART 100 UNIT/ML IV SOLN
10.0000 [IU] | Freq: Once | INTRAVENOUS | Status: AC
  Administered 2015-01-21: 10 [IU] via INTRAVENOUS
  Filled 2015-01-21: qty 0.1

## 2015-01-21 NOTE — Consult Note (Signed)
WOC wound consult note Reason for Consult: Readmit, fall and weakness at home. Wound type: Trauma wound to left anterior calf, injury sustained at home 01/09/15.  This was a large blood filled blister and with enzymatic debriding, is now an open lesion with a clean wound bed.  Left ankle abrasion has healed.  We are protecting this area with silicone border foam dressing.   Pressure Ulcer POA: Yes/No Measurement: 13.5 cm x 7 cm x 0.1 cm Left anterior calf.  1 cm x 1 cm x 0.1 cm abrasion on calf distal to the larger wound.  Wound ZOX:WRUEAbed:ruddy red Drainage (amount, consistency, odor) Moderate serosanguinous drainage.  No odor at this time.  Periwound: Dry skin bilateral lower extremities.  No edema noted at this time. Son at bedside and states this has resolved.  Dressing procedure/placement/frequency: Cleanse ulcers to left anterior calf with NS and pat gently dry.  Apply Xeroform gauze to wound bed.  Cover with 4x4 gauze.  Secure with kerlix and tape.  Change daily. Bedside nurse can perform.  May wrap legs from base of toes to just below knee with ace wrap if legs begin to swell. Recommend follow up with wound care center after discharge.  Patient had an appointment and was admitted and unable to keep the appointment.  Will not follow at this time.  Please re-consult if needed.  Maple HudsonKaren Saurabh Hettich RN BSN [facility]ON Pager [phone]

## 2015-01-21 NOTE — Evaluation (Addendum)
Physical Therapy Evaluation Patient Details Name: Alicia KannerVirgie K Trujillo MRN: [id number] DOB: [birth year] Today's Date: 01/21/2015   History of Present Illness  Readmitted due to fall from hyperkalemia, with wound on RLE due to hitting against a chair.  PMHx:  COPD, CHF, CKD, regurg several heart valves, chronic respiratory failure, a-fib  Clinical Impression  Pt returns and PT assessment noting significantly declined from her last visit with PT 2 days ago.  Her plan is to go to SNF as she is much more dependent and unsafe, with wet cough and declined endurance.  Pt will be considered for several locations by son, and discussed with case management to start looking for a bed.    Follow Up Recommendations SNF    Equipment Recommendations  None recommended by PT    Recommendations for Other Services       Precautions / Restrictions Precautions Precautions: Fall Restrictions Weight Bearing Restrictions: No      Mobility  Bed Mobility Overal bed mobility: Needs Assistance Bed Mobility: Supine to Sit     Supine to sit: Mod assist        Transfers Overall transfer level: Needs assistance Equipment used: Rolling walker (2 wheeled);1 person hand held assist Transfers: Sit to/from BJ'sStand;Stand Pivot Transfers Sit to Stand: Mod assist Stand pivot transfers: Min assist;Mod assist       General transfer comment: cued hand placement and talked with her about trying to sit without being lined up to chair  Ambulation/Gait Ambulation/Gait assistance: Min assist Ambulation Distance (Feet): 20 Feet Assistive device: Rolling walker (2 wheeled) Gait Pattern/deviations: Step-to pattern;Shuffle;Wide base of support Gait velocity: slow Gait velocity interpretation: Below normal speed for age/gender General Gait Details: Pt has slow pace with difficulty moving walker, leaning backward at times and responds to loud verbal and tactile cues.  Stairs            Wheelchair Mobility     Modified Rankin (Stroke Patients Only)       Balance Overall balance assessment: Needs assistance Sitting-balance support: Feet supported Sitting balance-Leahy Scale: Fair   Postural control: Posterior lean Standing balance support: Bilateral upper extremity supported Standing balance-Leahy Scale: Poor Standing balance comment: tends to lean over on the walker                             Pertinent Vitals/Pain Pain Assessment: Faces Faces Pain Scale: Hurts little more Pain Location: R leg injury Pain Intervention(s): Monitored during session;Repositioned    Home Living Family/patient expects to be discharged to:: Inpatient rehab Living Arrangements: Children               Additional Comments: HOH with having 24/7 care with son at home    Prior Function Level of Independence: Needs assistance   Gait / Transfers Assistance Needed: gait on no AD previously to last 2 months, now RW  ADL's / Homemaking Assistance Needed: son assists        Hand Dominance        Extremity/Trunk Assessment   Upper Extremity Assessment: Generalized weakness           Lower Extremity Assessment: Generalized weakness      Cervical / Trunk Assessment: Kyphotic  Communication   Communication: HOH;Other (comment) (Hears from L ear)  Cognition Arousal/Alertness: Awake/alert Behavior During Therapy: WFL for tasks assessed/performed Overall Cognitive Status: Within Functional Limits for tasks assessed  General Comments General comments (skin integrity, edema, etc.): New R leg injury from a partial fall after being home overnight, has some pain and new wet cough that this PT does not remember hearing on last visit.    Exercises        Assessment/Plan    PT Assessment Patient needs continued PT services  PT Diagnosis Difficulty walking   PT Problem List Decreased strength;Decreased range of motion;Decreased activity  tolerance;Decreased balance;Decreased mobility;Decreased coordination;Decreased knowledge of use of DME;Cardiopulmonary status limiting activity;Decreased skin integrity;Pain  PT Treatment Interventions DME instruction;Gait training;Stair training;Functional mobility training;Therapeutic activities;Therapeutic exercise;Balance training;Patient/family education;Neuromuscular re-education;Cognitive remediation   PT Goals (Current goals can be found in the Care Plan section) Acute Rehab PT Goals Patient Stated Goal: none stated PT Goal Formulation: With patient/family Time For Goal Achievement: 02/04/15 Potential to Achieve Goals: Good    Frequency Min 2X/week   Barriers to discharge Other (comment) (Needs moderate continual assist for all mobility) Son is not able to help to the degree pt would need and PT more efficient inpt    Co-evaluation               End of Session Equipment Utilized During Treatment: Gait belt;Oxygen Activity Tolerance: Patient limited by fatigue Patient left: in chair;with call bell/phone within reach;with family/visitor present Nurse Communication: Mobility status    Functional Assessment Tool Used: clinical judgment Functional Limitation: Mobility: Walking and moving around Mobility: Walking and Moving Around Current Status (U9811): At least 40 percent but less than 60 percent impaired, limited or restricted Mobility: Walking and Moving Around Goal Status [phone]): At least 40 percent but less than 60 percent impaired, limited or restricted    Time: 2956-2130 PT Time Calculation (min) (ACUTE ONLY): 36 min   Charges:   PT Evaluation $Initial PT Evaluation Tier I: 1 Procedure PT Treatments $Gait Training: 8-22 mins   PT G Codes:   PT G-Codes **NOT FOR INPATIENT CLASS** Functional Assessment Tool Used: clinical judgment Functional Limitation: Mobility: Walking and moving around Mobility: Walking and Moving Around Current Status (Q6578): At least 40  percent but less than 60 percent impaired, limited or restricted Mobility: Walking and Moving Around Goal Status [phone]): At least 40 percent but less than 60 percent impaired, limited or restricted    Ivar Drape 01/21/2015, 1:37 PM   Samul Dada, PT MS Acute Rehab Dept. Number: [facility] [id number] and [facility] [phone]

## 2015-01-21 NOTE — Discharge Summary (Signed)
[facility] [place] [facility]   PATIENT NAME: Alicia Trujillo    MR#:  [id number]  DATE OF BIRTH:  1925/04/01  DATE OF ADMISSION:  01/20/2015 ADMITTING PHYSICIAN: Wyatt Hasteavid K Hower, MD  DATE OF DISCHARGE: 01/21/2015  PRIMARY CARE PHYSICIAN: Marisue IvanLINTHAVONG, KANHKA, MD    ADMISSION DIAGNOSIS:  Hyperkalemia [E87.5] Weakness [R53.1] Elevated troponin I level [R79.89] Cystitis [N30.90]  DISCHARGE DIAGNOSIS:  Principal Problem:   Hyperkalemia Active Problems:   UTI (lower urinary tract infection)   SECONDARY DIAGNOSIS:   Past Medical History  Diagnosis Date  . Asthma   . COPD (chronic obstructive pulmonary disease)   . A-fib   . Gout   . Essential hypertension   . Cardiomyopathy   . Chronic kidney disease   . Chronic systolic congestive heart failure   . Severe mitral regurgitation   . Mild aortic regurgitation   . Severe tricuspid regurgitation     HOSPITAL COURSE:  This is a 81-year-old female with chronic kidney disease with baseline creatinine of 1.9, atrial fibrillation on Coumadin who was recently discharged and then readmitted for generalized weakness and hyperkalemia..  1. Hyperkalemia: Patient's potassium is slightly elevated this morning she received Kayexalate, insulin and dextrose. I suspect her hyperkalemia is due to supplementation as well as her kidney injury.  2. Urinary tract infection, acute cystitis without hematuria: Patient is on Rocephin. She will be discharged on by mouth Keflex. Urine cultures are pending.  3. Left lower extremity wound infection: Patient is currently on doxycycline and Keflex. Wound care has seen the patient and has made recommendations. Patient will be scheduled to see wound care as an outpatient as well. She actually had a new appointment but this was canceled.  4. Generalized weakness: Patient will need skilled facility.   5. Chronic atrial fibrillation: Patient's currently on Coumadin at home. INR is  3.46. Coumadin is on hold and will need to be monitored at skilled nursing facility.   DISCHARGE CONDITIONS AND DIET:  Patient being discharged to skilled nursing facility on a heart healthy diet in stable condition   CONSULTS OBTAINED:  Treatment Team:  Wyatt Hasteavid K Hower, MD  DRUG ALLERGIES:   Allergies  Allergen Reactions  . Latex Rash  . Levofloxacin Rash  . Sulfa Antibiotics Rash    DISCHARGE MEDICATIONS:    albuterol 2 puffs every 4 hours when necessary   allopurinol 100 mg daily  Maalox 30 mL every 6 hours when necessary   calcium plus D 1 tablet daily Keflex 500 mg by mouth 3 times a day for 10 days  DOXYCYCLINE  100 mg by mouth twice a day 10 days Ferrous sulfate 325 daily Metoprolol. 15 mg by mouth twice a day Advair one puff twice a day Allegra 180 mg daily Multivitamin 1 tablet daily  Coumadin is on hold for now. Daily INR should be checked. Once INR is less than 3 then may resume Coumadin at 2 mg daily. Patient's INR needs to be checked daily.         Today   CHIEF COMPLAINT:  No acute events overnight.   VITAL SIGNS:  Blood pressure 108/59, pulse 75, temperature 97.4 F (36.3 C), temperature source Oral, resp. rate 16, height 5\' 2"  (1.575 m), weight 60.056 kg (132 lb 6.4 oz), SpO2 100 %.   REVIEW OF SYSTEMS:  Review of Systems  Constitutional: Negative for fever, chills and malaise/fatigue.  HENT: Positive for hearing loss. Negative for sore throat.   Eyes: Negative for  blurred vision.  Respiratory: Negative for cough, hemoptysis, shortness of breath and wheezing.   Cardiovascular: Negative for chest pain, palpitations and leg swelling.  Gastrointestinal: Negative for nausea, vomiting, abdominal pain, diarrhea and blood in stool.  Genitourinary: Negative for dysuria.  Musculoskeletal: Negative for back pain.  Neurological: Positive for weakness. Negative for dizziness, tremors and headaches.  Endo/Heme/Allergies: Does not bruise/bleed easily.      PHYSICAL EXAMINATION:  GENERAL:  81 y.o.-year-old patient lying in the bed with no acute distress.  NECK:  Supple, no jugular venous distention. No thyroid enlargement, no tenderness.  LUNGS: Normal breath sounds bilaterally, no wheezing, rales,rhonchi  No use of accessory muscles of respiration.  CARDIOVASCULAR: S1, S2 normal. 2/6 SEM NO rubs, or gallops.  ABDOMEN: Soft, non-tender, non-distended. Bowel sounds present. No organomegaly or mass.  EXTREMITIES: minimal edema, cyanosis, or clubbing.  PSYCHIATRIC: The patient is alert and oriented x 3.  SKIN: No obvious rash, lesion, or ulcer.   DATA REVIEW:   CBC  Recent Labs Lab 01/21/15 0451  WBC 7.8  HGB 8.5*  HCT 26.6*  PLT 66*    Chemistries   Recent Labs Lab 01/20/15 1651 01/21/15 0451 01/21/15 1016  NA 130* 134*  --   K 5.7* 5.4* 5.4*  CL 95* 98*  --   CO2 25 26  --   GLUCOSE 104* 102*  --   BUN 97* 94*  --   CREATININE 1.93* 1.89*  --   CALCIUM 8.8* 8.9  --   AST 49*  --   --   ALT 45  --   --   ALKPHOS 109  --   --   BILITOT 1.0  --   --     Cardiac Enzymes  Recent Labs Lab 01/20/15 2304 01/21/15 0451 01/21/15 1016  TROPONINI 0.06* 0.06* 0.06*    Microbiology Results  @  RADIOLOGY:  No results found.    Management plans discussed with the patient's son and he is in agreement. Stable for discharge SNF  Patient should follow up with PCP in 1 week  CODE STATUS:     Code Status Orders        Start     Ordered   01/20/15 2030  Full code   Continuous     01/20/15 2029      Coumadin is on hold for now. Daily INR should be checked. Once INR is less than 3 then may resume Coumadin at 2 mg daily. Patient's INR needs to be checked daily.   TOTAL TIME TAKING CARE OF THIS PATIENT: 35 minutes.    Myleah Cavendish M.D on 01/21/2015 at 11:25 AM  Between 7am to 6pm - Pager - [phone] After 6pm go to [email or web address] - password [email or web address] [facility]  [facility] [place] [facility]  Office   [phone]  CC: Primary care physician; Marisue Ivan, MD

## 2015-01-21 NOTE — Progress Notes (Signed)
MD notified of pt troponin levels, edema, SOB, desaturation, RT - consulted and stated pt sounded wet - (rales, crackles). MD ordered to d/c iv fluids, will continue to monitor.

## 2015-01-21 NOTE — Clinical Social Work Note (Signed)
Clinical Social Work Assessment  Patient Details  Name: Alicia Trujillo MRN: [id number] Date of Birth: 01-10-1925  Date of referral:  01/21/15               Reason for consult:  Facility Placement                Permission sought to share information with:  Facility Medical sales representativeContact Representative, Family Supports Permission granted to share information::  Yes, Verbal Permission Granted  Name::        Agency::     Relationship::     Contact Information:     Housing/Transportation Living arrangements for the past 2 months:   (hospital for an extended stay then home then readmit to hospital) Source of Information:  Patient, Adult Children Patient Interpreter Needed:  None Criminal Activity/Legal Involvement Pertinent to Current Situation/Hospitalization:  No - Comment as needed Significant Relationships:  Adult Children Lives with:  Adult Children Do you feel safe going back to the place where you live?  Yes Need for family participation in patient care:  Yes (Comment)  Care giving concerns:  Patient has two sons and lives with one of her sons.    Social Worker assessment / plan:  CSW spoke with patient and her two sons today regarding physical therapy recommendations for STR. Physician has discharged patient for today, prior to PT determination. Patient's son are wanting STR and patient is agreeable. CSW has informed the physician and she has converted discharge to STR. Bed offers have been extended and patient's sons have chosen [facility] due to sons living closest to that facility. Patient to discharge to [facility] and [facility] is aware. Orders sent and nurse to call report.   Employment status:  Retired Health and safety inspectornsurance information:  Managed Care PT Recommendations:  Skilled Nursing Facility Information / Referral to community resources:  Skilled Nursing Facility  Patient/Family's Response to care:  Patient and sons are in agreement to [facility]  Patient/Family's Understanding of  and Emotional Response to Diagnosis, Current Treatment, and Prognosis:  Patient and sons are looking forward to patient getting the rehab she needs so she can return home.   Emotional Assessment Appearance:  Appears stated age Attitude/Demeanor/Rapport:   (pleasant and cooperative) Affect (typically observed):  Appropriate, Accepting Orientation:    Alcohol / Substance use:  Never Used Psych involvement (Current and /or in the community):  No (Comment)  Discharge Needs  Concerns to be addressed:  Care Coordination Readmission within the last 30 days:  Yes Current discharge risk:  None Barriers to Discharge:  No Barriers Identified   York SpanielMonica Imari Sivertsen, LCSW 01/21/2015, 3:21 PM

## 2015-01-21 NOTE — Progress Notes (Signed)
[facility] [place] [facility]   PATIENT NAME: Alicia MastVirgie Trujillo    MR#:  [id number]  DATE OF BIRTH:  [birth year]  SUBJECTIVE:  No acute events overnight. Son at bedisde he would like to take patient home if she can ambulate.  REVIEW OF SYSTEMS:    Review of Systems  Constitutional: Negative for fever, chills and malaise/fatigue.  HENT: Positive for hearing loss. Negative for sore throat.   Eyes: Negative for blurred vision.  Respiratory: Negative for cough, hemoptysis, shortness of breath and wheezing.   Cardiovascular: Negative for chest pain, palpitations and leg swelling.  Gastrointestinal: Negative for nausea, vomiting, abdominal pain, diarrhea and blood in stool.  Genitourinary: Negative for dysuria.  Musculoskeletal: Negative for back pain.  Neurological: Positive for weakness. Negative for dizziness, tremors, sensory change and headaches.  Endo/Heme/Allergies: Does not bruise/bleed easily.    Tolerating Diet:yes      DRUG ALLERGIES:   Allergies  Allergen Reactions  . Latex Rash  . Levofloxacin Rash  . Sulfa Antibiotics Rash    VITALS:  Blood pressure 108/59, pulse 75, temperature 97.4 F (36.3 C), temperature source Oral, resp. rate 16, height 5\' 2"  (1.575 m), weight 60.056 kg (132 lb 6.4 oz), SpO2 100 %.  PHYSICAL EXAMINATION:   Physical Exam  Constitutional: She is oriented to person, place, and time and well-developed, well-nourished, and in no distress. No distress.  HENT:  Head: Normocephalic.  Eyes: No scleral icterus.  Neck: Normal range of motion. Neck supple. No JVD present. No tracheal deviation present.  Cardiovascular: Normal rate and regular rhythm.  Exam reveals no gallop and no friction rub.   Murmur heard. Pulmonary/Chest: Effort normal and breath sounds normal. No respiratory distress. She has no wheezes. She has no rales. She exhibits no tenderness.  Abdominal: Soft. Bowel sounds are normal. She exhibits no  distension and no mass. There is no tenderness. There is no rebound and no guarding.  Musculoskeletal: Normal range of motion. She exhibits edema.  Neurological: She is alert and oriented to person, place, and time.  Skin: Skin is warm. No rash noted. No erythema.  Psychiatric: Affect and judgment normal.      LABORATORY PANEL:   CBC  Recent Labs Lab 01/21/15 0451  WBC 7.8  HGB 8.5*  HCT 26.6*  PLT 66*   ------------------------------------------------------------------------------------------------------------------  Chemistries   Recent Labs Lab 01/20/15 1651 01/21/15 0451  NA 130* 134*  K 5.7* 5.4*  CL 95* 98*  CO2 25 26  GLUCOSE 104* 102*  BUN 97* 94*  CREATININE 1.93* 1.89*  CALCIUM 8.8* 8.9  AST 49*  --   ALT 45  --   ALKPHOS 109  --   BILITOT 1.0  --    ------------------------------------------------------------------------------------------------------------------  Cardiac Enzymes  Recent Labs Lab 01/20/15 1651 01/20/15 2304 01/21/15 0451  TROPONINI 0.06* 0.06* 0.06*   ------------------------------------------------------------------------------------------------------------------  RADIOLOGY:  No results found.   ASSESSMENT AND PLAN:   This is a 81-year-old female with chronic kidney disease with baseline creatinine of 1.9, atrial fibrillation on Coumadin who was recently discharged and then readmitted for generalized weakness and hyperkalemia..  1. Hyperkalemia: Patient's potassium is slightly elevated this morning she received Kayexalate, insulin and dextrose. A repeat potassium is at noon. I suspect her hyperkalemia is due to supplementation as well as her kidney injury.  2. Urinary tract infection, acute cystitis without hematuria: Patient is on Rocephin. Follow up urine cultures   3. Left lower extremity wound infection: Patient is currently  on doxycycline and Keflex. Wound care has seen the patient and has made recommendations.  Patient will be scheduled to see wound care as an outpatient as well. She actually had a new appointment but this was canceled.  4. Generalized weakness: Patient will have physical therapy consultation. Patient may need skilled nursing facility.  5. Chronic atrial fibrillation: Patient's currently on Coumadin at home. INR is 3.46. I will hold Coumadin for today.    Management plans discussed with the patient's son and he is in agreement.  CODE STATUS: FULL  TOTAL TIME TAKING CARE OF THIS PATIENT: 35 minutes.   Greater than 50% counseling and coordination of care  POSSIBLE ready for D/C awaiting PT consult  Chrles Selley M.D on 01/21/2015 at 11:01 AM  Between 7am to 6pm - Pager - [phone] After 6pm go to [email or web address] - password [email or web address] [facility]  [place] [facility]  Office  [phone]  CC: Primary care physician; Marisue Ivan, MD

## 2015-01-29 ENCOUNTER — Other Ambulatory Visit
Admission: RE | Admit: 2015-01-29 | Discharge: 2015-01-29 | Disposition: A | Discharge status: Home / Self Care | Payer: Medicare Other | Attending: Family Medicine | Admitting: Family Medicine

## 2015-01-29 DIAGNOSIS — I4891 Unspecified atrial fibrillation: Secondary | ICD-10-CM | POA: Diagnosis present

## 2015-01-29 LAB — PROTIME-INR
INR: 6.05 — AB (ref 0.00–1.49)
Prothrombin Time: 53.5 seconds — ABNORMAL HIGH (ref 11.6–15.2)

## 2015-02-12 LAB — PATHOLOGIST SMEAR REVIEW

## 2015-02-22 DEATH — deceased

## 2015-03-12 IMAGING — CR PELVIS - 1-2 VIEW
1 series · 1 of 1 positions shown · non-contrast
Comparison: DG FEMUR 2V*R* dated 10/10/2013

CLINICAL DATA: Trauma and pain.

EXAM:
PELVIS - 1-2 VIEW

[t pelvis ap]
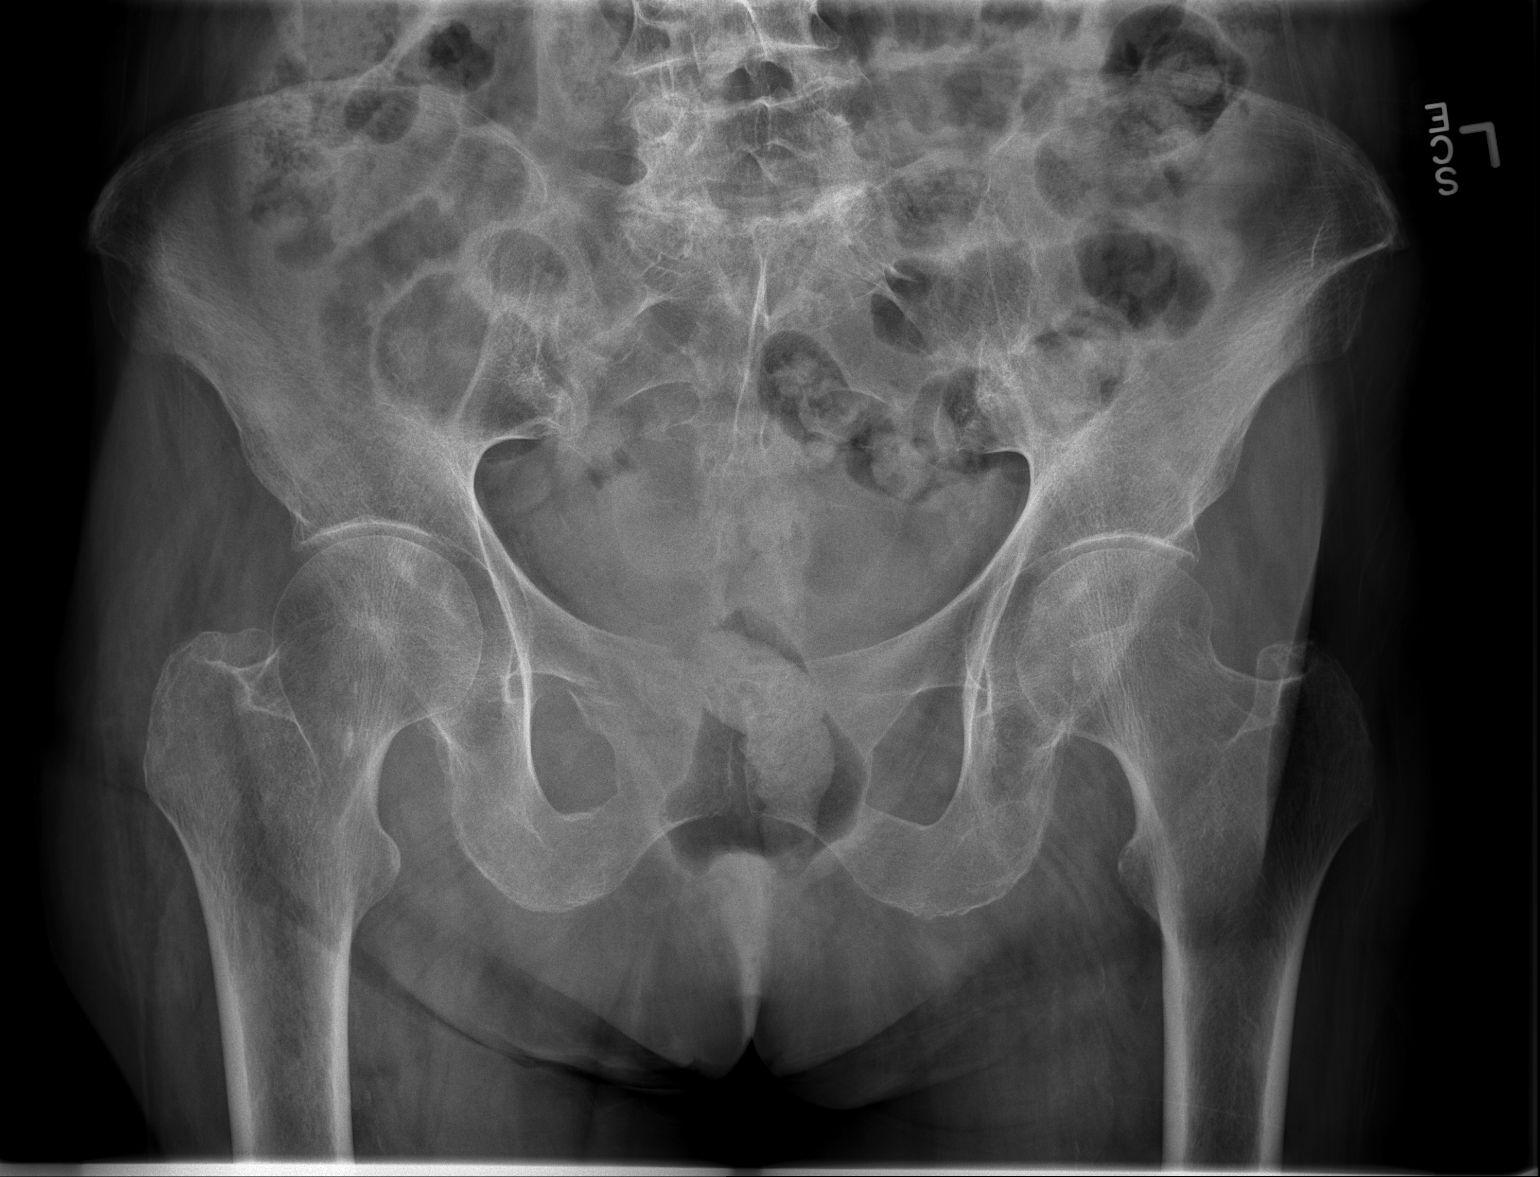

[1 of 1 positions shown; findings below may reference images not displayed]

FINDINGS: AP view of the pelvis. Femoral heads are located. There is osseous
irregularity most consistent with fracture about the right superior
pubic ramus. No definite inferior pubic ramus fracture. Mild
osteopenia.
IMPRESSION: Right superior pubic ramus fracture, incompletely characterized on
this single AP view of the pelvis. Consider dedicated right hip
films.

## 2016-06-11 IMAGING — CR DG TIBIA/FIBULA 2V*L*
1 series · 2 of 2 positions shown · non-contrast
Comparison: None.

CLINICAL DATA: Anterior wound with bruising and swelling

EXAM:
LEFT TIBIA AND FIBULA - 2 VIEW

[Series 1: ap · 0.17mm/px · 2 of 2 slices shown]
[im 1/2]
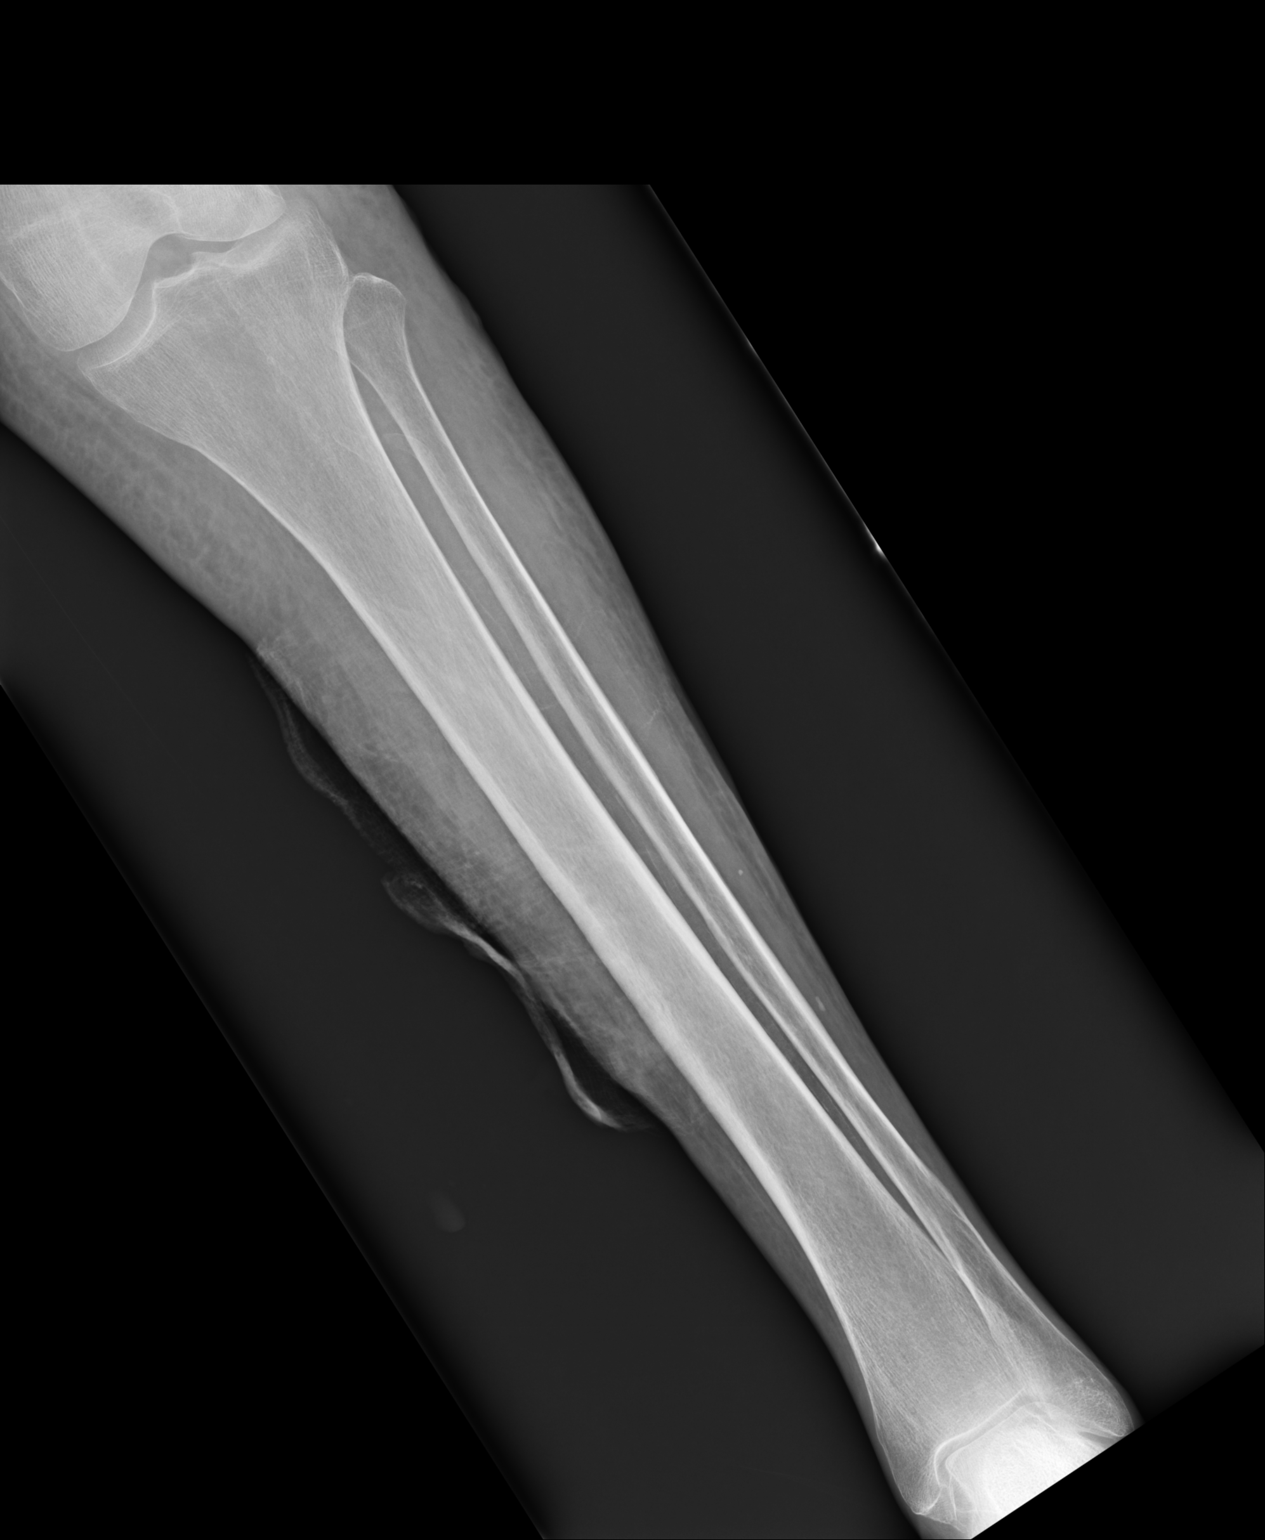
[im 2/2]
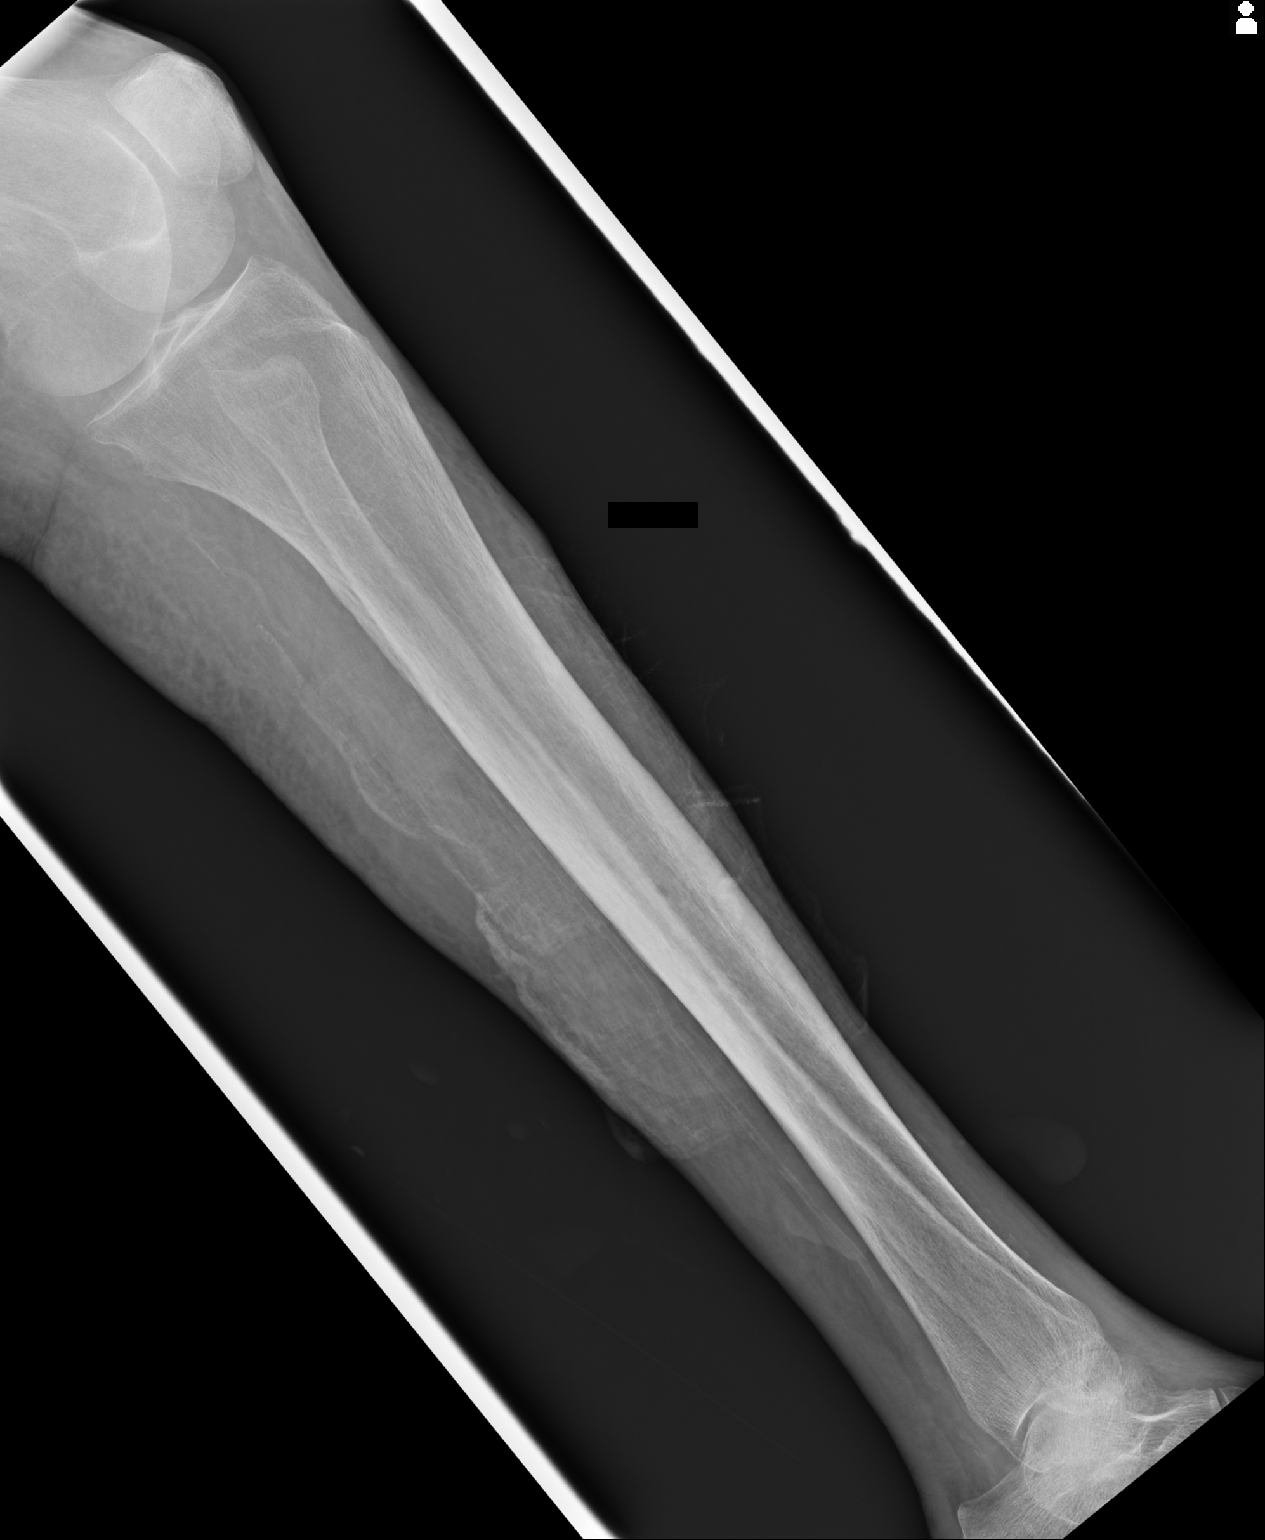

[2 of 2 positions shown; findings below may reference images not displayed]

FINDINGS: Frontal and lateral views were obtained. There is a bandage over the
mid tibial region medially. There is no fracture or dislocation. No
erosive change or bony destruction. There is mild benign-appearing
endosteal thickening along the anterior and medial mid tibia, likely
reactive due to trauma in this area.
IMPRESSION: Mild benignendosteal thickening mid tibia. No erosive change or bony
destruction. No abnormal periosteal reaction. No fracture or
dislocation. No soft tissue air seen.

## 2016-06-14 IMAGING — CR DG CHEST 1V
1 series · 1 of 1 positions shown · non-contrast
Comparison: None.

CLINICAL DATA: Generalized weakness

EXAM:
CHEST  1 VIEW

[ap]
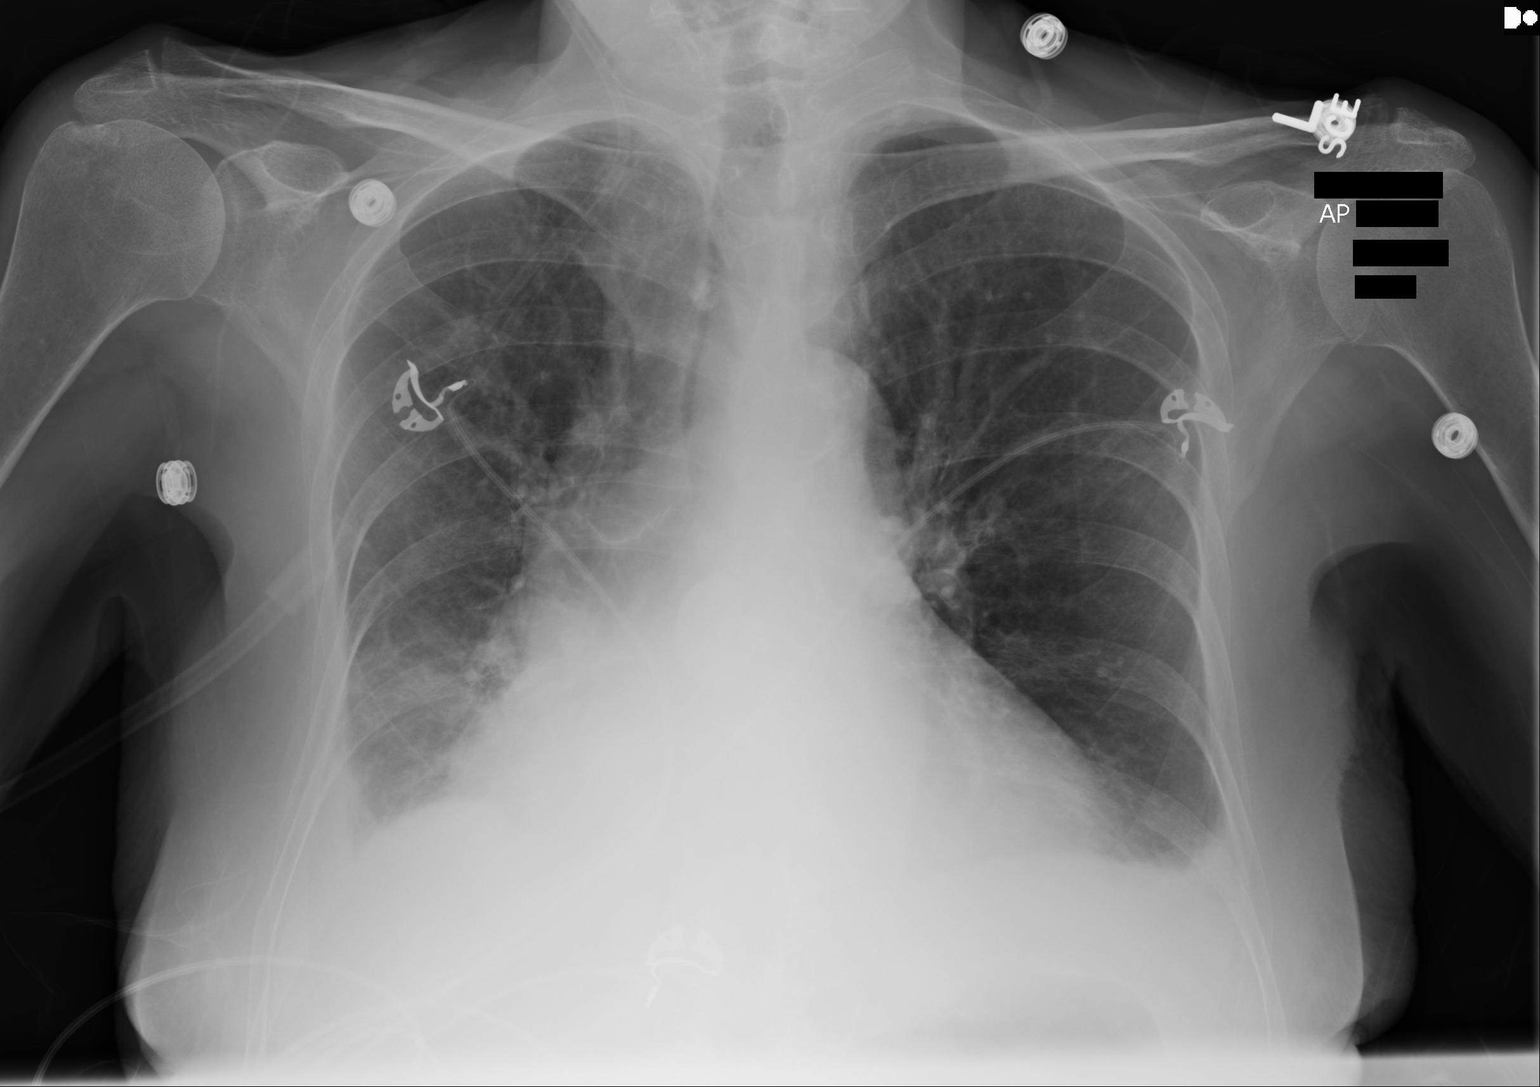

[1 of 1 positions shown; findings below may reference images not displayed]

FINDINGS: Cardiac shadow is enlarged. Small bilateral pleural effusions are
noted. Slight increased density is noted in the right lung base
consistent with atelectasis. No acute bony abnormality is noted.
IMPRESSION: Bilateral pleural effusions.

Right basilar atelectasis.

Cardiomegaly.

## 2016-06-19 IMAGING — CR DG CHEST 1V
1 series · 1 of 1 positions shown · non-contrast
Comparison: 01/13/2015

CLINICAL DATA: Lung crackles.

EXAM:
CHEST  1 VIEW

[ap]
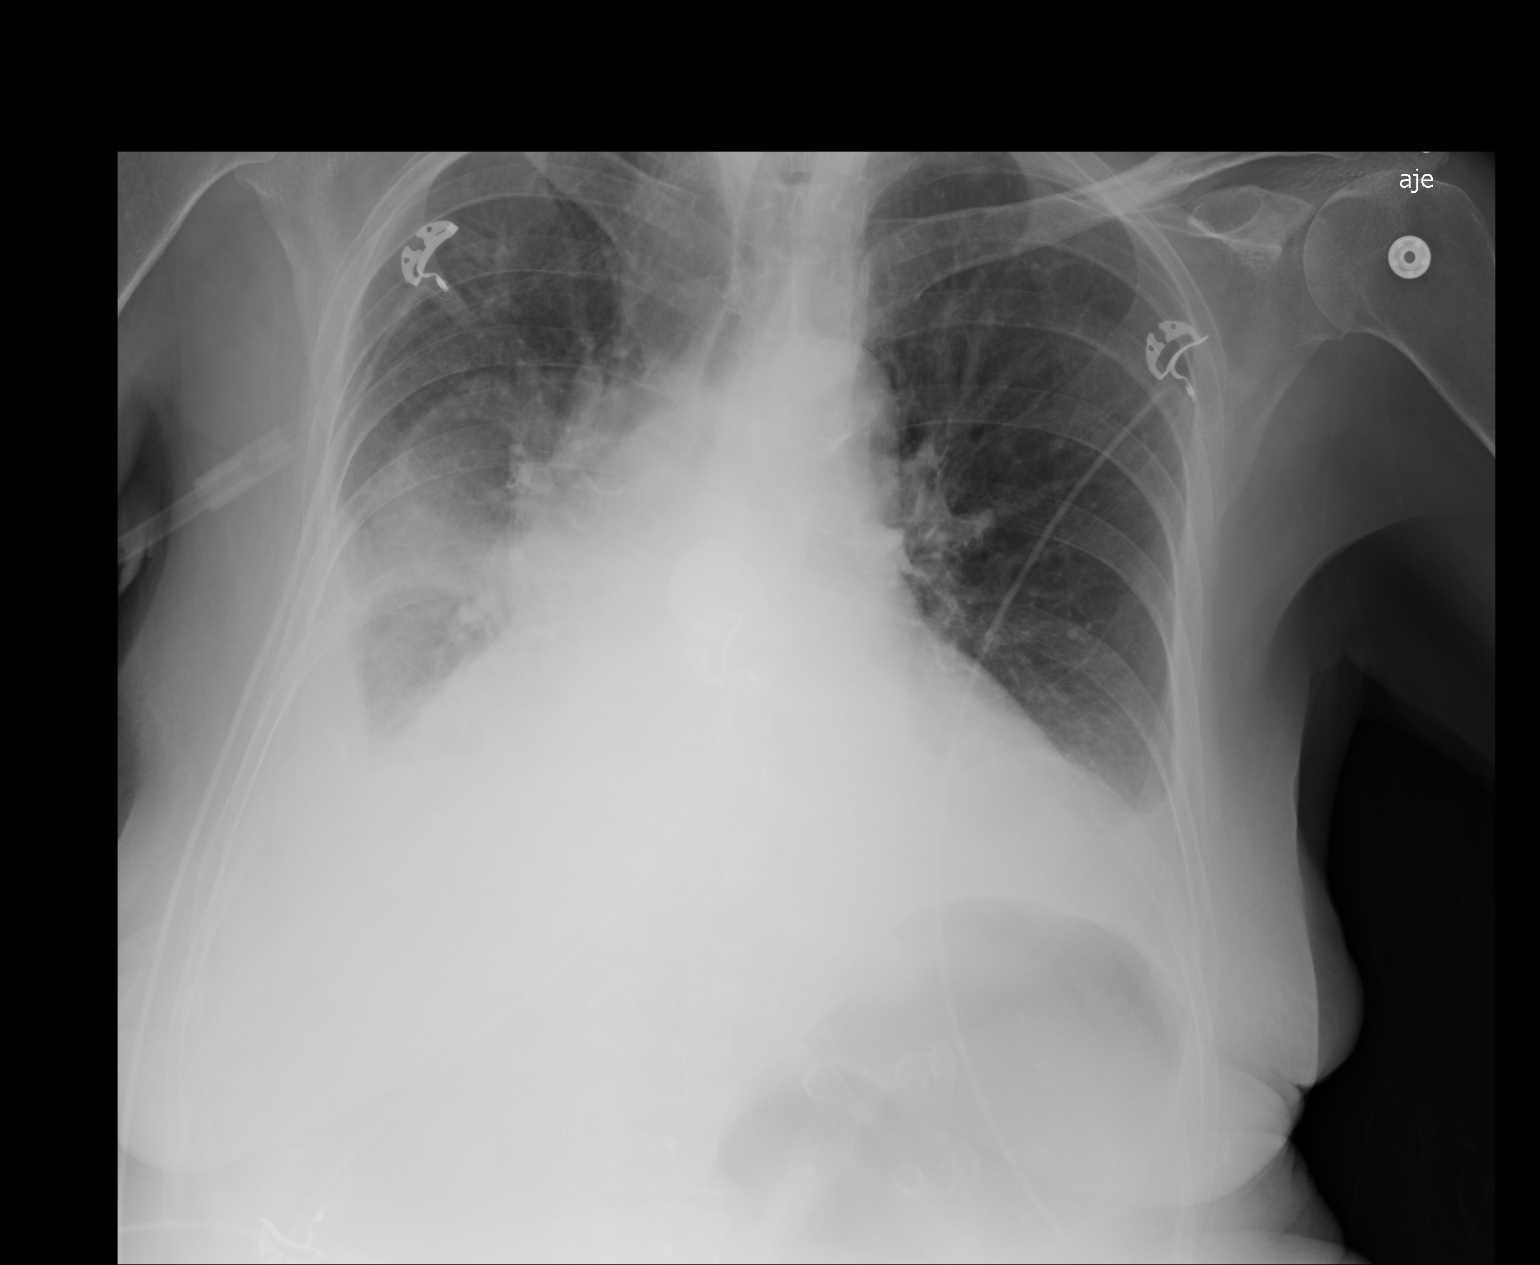

[1 of 1 positions shown; findings below may reference images not displayed]

FINDINGS: Bilateral pleural effusions, right greater than left, slightly
increased since prior study. There is cardiomegaly. Increasing
bibasilar opacities, likely atelectasis. Right perihilar atelectasis
or infiltrate.
IMPRESSION: Mildly increased bilateral pleural effusions and airspace opacities,
both greater on the right.

## 2016-10-03 IMAGING — US US RENAL
1 series · 14 of 25 positions shown · non-contrast
Comparison: None.

CLINICAL DATA: Acute renal failure.

EXAM:
RENAL / URINARY TRACT ULTRASOUND COMPLETE

[Series 1: us renal · 0.23mm/px · 14 of 55 slices shown]
[im 1/55]
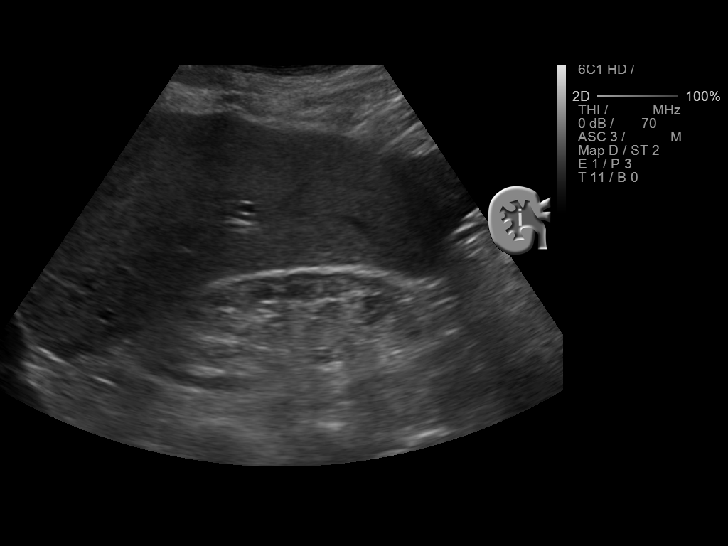
[im 5/55]
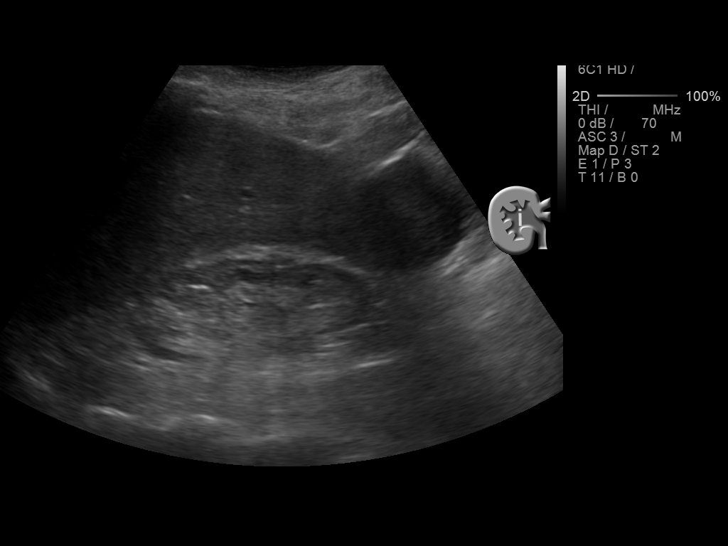
[im 10/55]
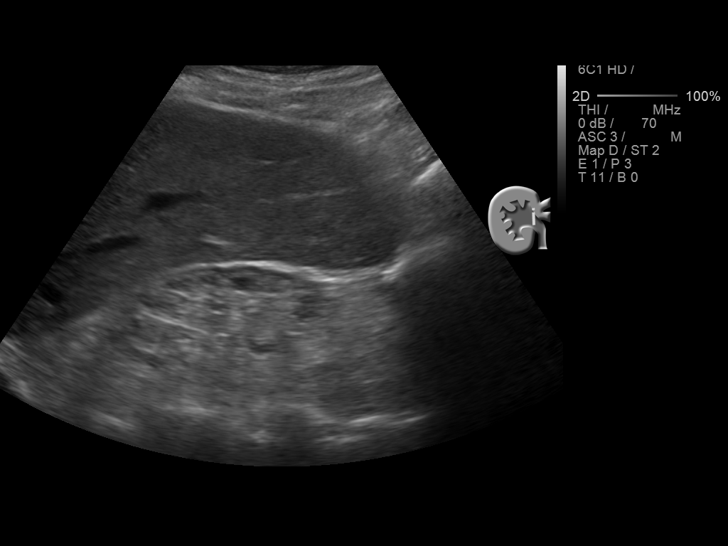
[im 14/55]
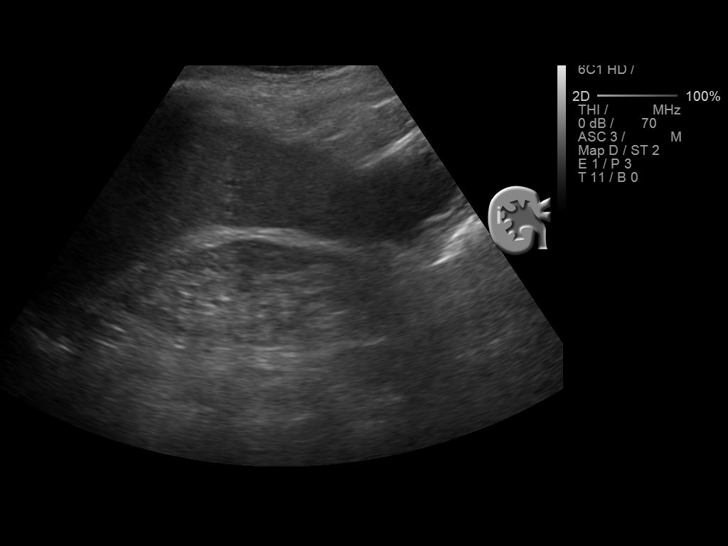
[im 19/55]
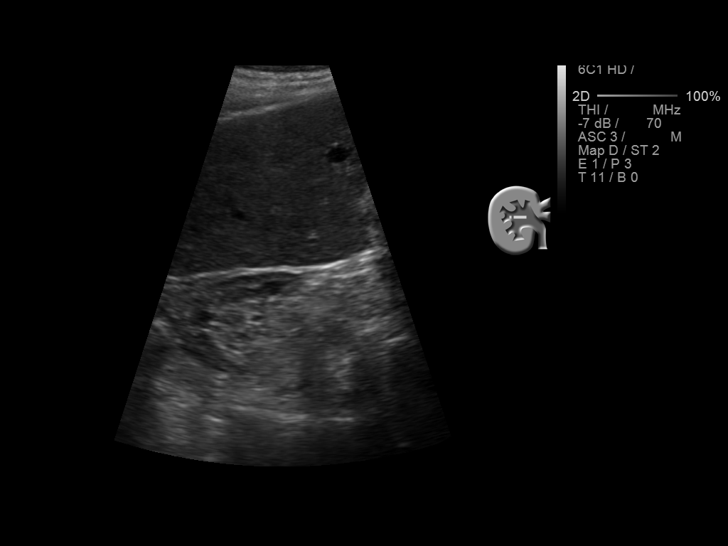
[im 21/55]
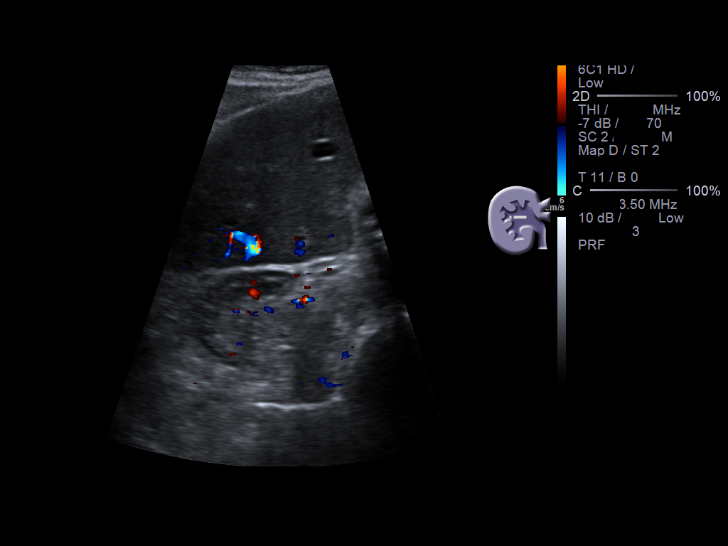
[im 25/55]
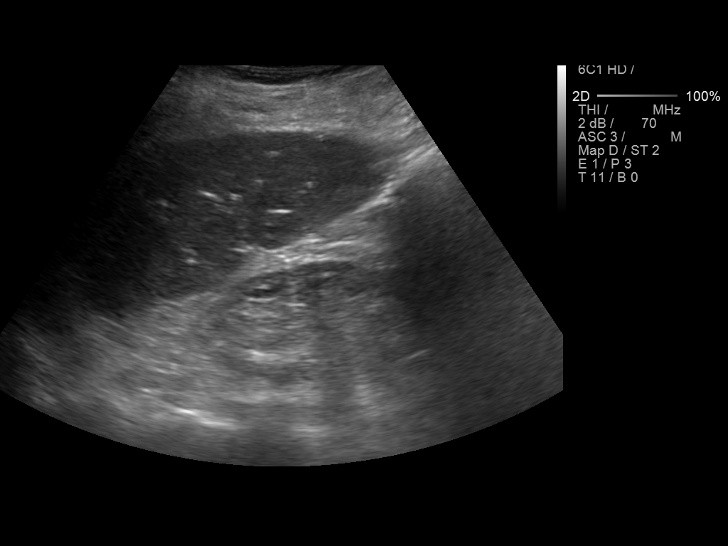
[im 30/55]
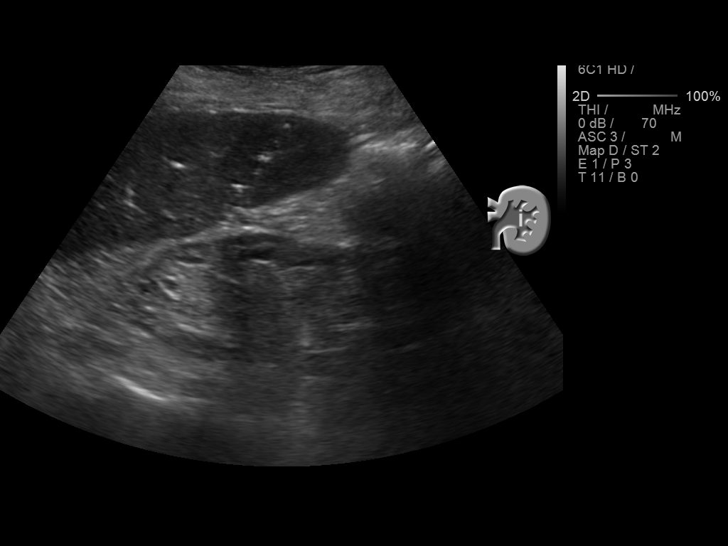
[im 34/55]
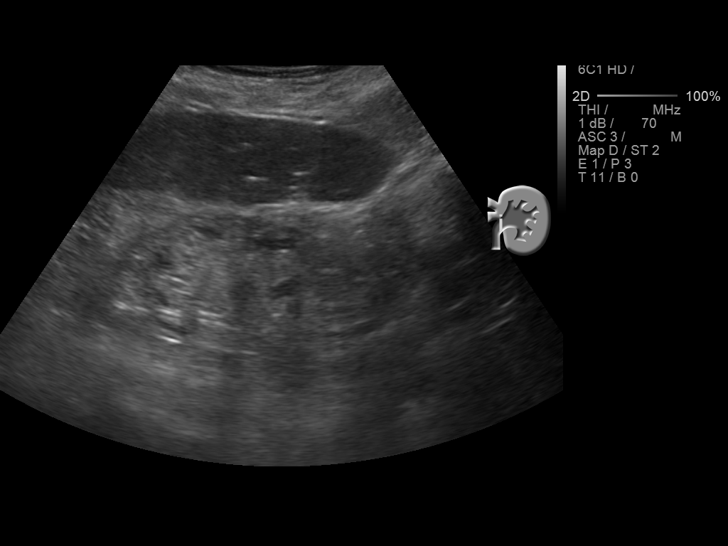
[im 37/55]
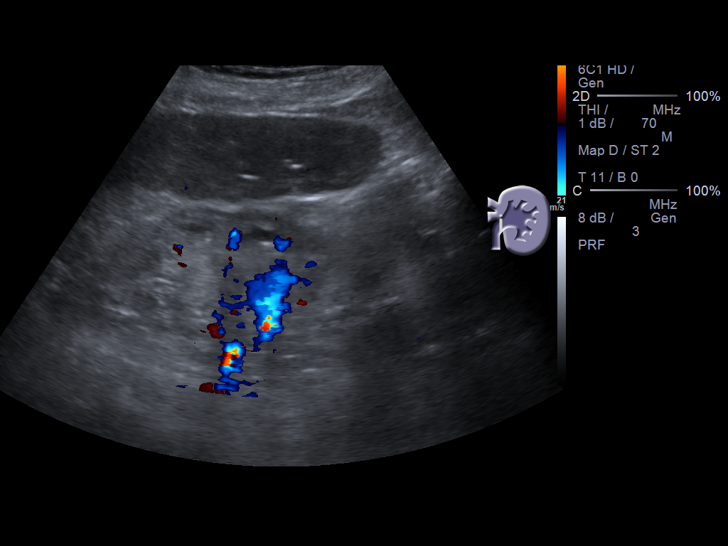
[im 41/55]
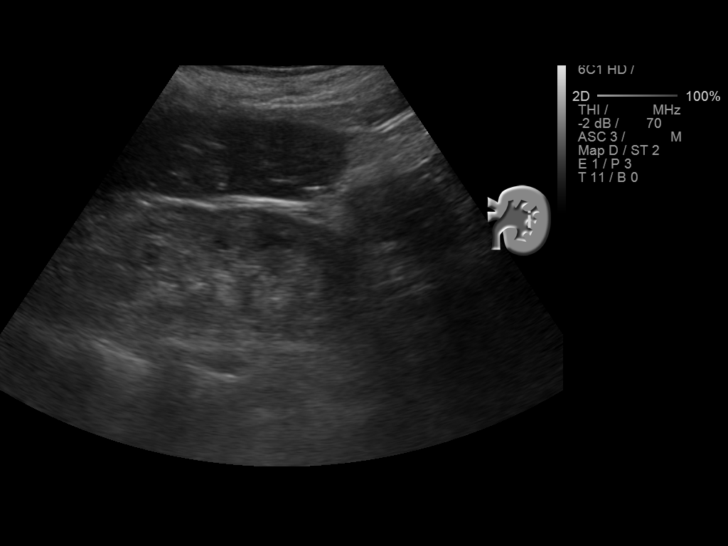
[im 46/55]
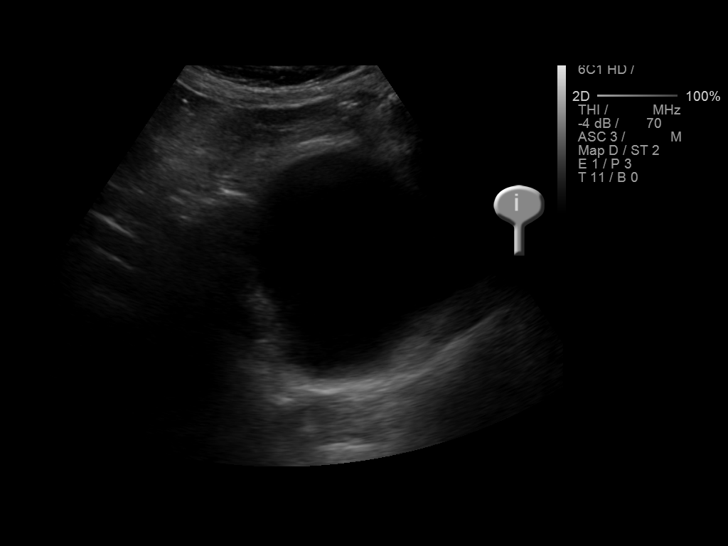
[im 50/55]
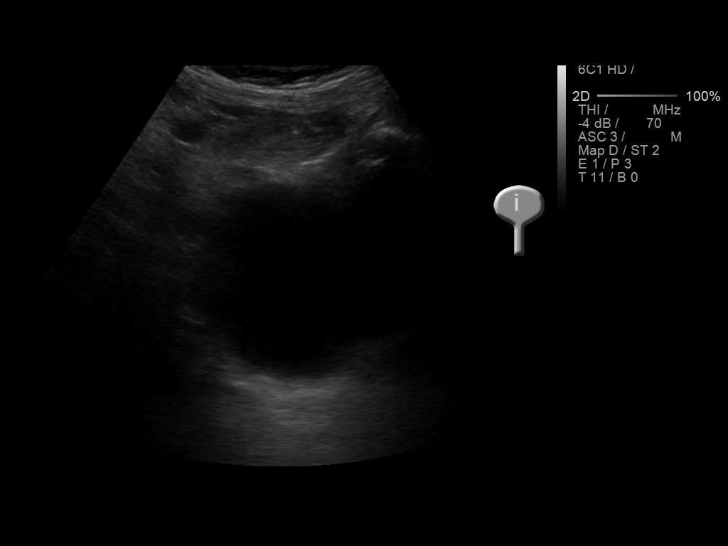
[im 55/55]
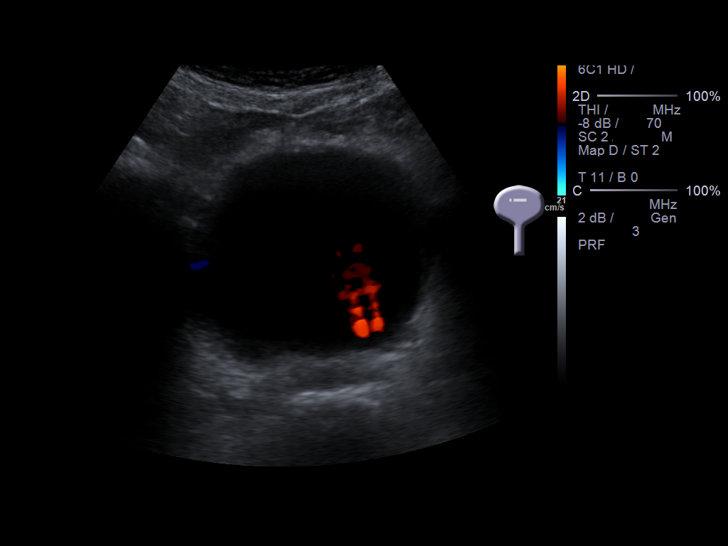

[14 of 25 positions shown; findings below may reference images not displayed]

FINDINGS: Right Kidney:

Length: 8.7 cm. Parenchymal echogenicity is increased. No mass or
hydronephrosis.

Left Kidney:

Length: 8.9 cm. Parenchymal echogenicity is increased. No mass or
hydronephrosis.

Bladder:

Ureteral jets are seen bilaterally.
IMPRESSION: Increased renal parenchymal echogenicity is in keeping with chronic
medical renal disease. No acute findings.
